# Patient Record
Sex: Female | Born: 1945 | Race: White | Hispanic: No | Smoking: Never smoker
Health system: Southern US, Community
[De-identification: ages and names within clinical notes are randomized; demographics above are authoritative.]

## PROBLEM LIST (undated history)

## (undated) DIAGNOSIS — E079 Disorder of thyroid, unspecified: Secondary | ICD-10-CM

## (undated) DIAGNOSIS — N200 Calculus of kidney: Secondary | ICD-10-CM

## (undated) DIAGNOSIS — R519 Headache, unspecified: Secondary | ICD-10-CM

## (undated) DIAGNOSIS — I10 Essential (primary) hypertension: Secondary | ICD-10-CM

## (undated) DIAGNOSIS — E7800 Pure hypercholesterolemia, unspecified: Secondary | ICD-10-CM

## (undated) DIAGNOSIS — G8929 Other chronic pain: Secondary | ICD-10-CM

## (undated) HISTORY — DX: Calculus of kidney: N20.0

## (undated) HISTORY — PX: TUBAL LIGATION: SHX77

## (undated) HISTORY — DX: Headache, unspecified: R51.9

## (undated) HISTORY — DX: Essential (primary) hypertension: I10

## (undated) HISTORY — PX: OTHER SURGICAL HISTORY: SHX169

## (undated) HISTORY — DX: Pure hypercholesterolemia, unspecified: E78.00

## (undated) HISTORY — DX: Disorder of thyroid, unspecified: E07.9

## (undated) HISTORY — DX: Other chronic pain: G89.29

---

## 1998-07-10 HISTORY — PX: APPENDECTOMY: SHX54

## 2011-11-24 ENCOUNTER — Encounter: Payer: Self-pay | Admitting: Family Medicine

## 2016-11-14 ENCOUNTER — Encounter: Payer: Self-pay | Admitting: Family Medicine

## 2018-05-09 LAB — HM MAMMOGRAPHY

## 2018-12-18 ENCOUNTER — Emergency Department (HOSPITAL_COMMUNITY): Payer: 59

## 2018-12-18 ENCOUNTER — Emergency Department (HOSPITAL_COMMUNITY)
Admission: EM | Admit: 2018-12-18 | Discharge: 2018-12-18 | Disposition: A | Discharge status: Home / Self Care | Payer: 59 | Attending: Emergency Medicine | Admitting: Emergency Medicine

## 2018-12-18 DIAGNOSIS — S43005A Unspecified dislocation of left shoulder joint, initial encounter: Secondary | ICD-10-CM | POA: Diagnosis not present

## 2018-12-18 DIAGNOSIS — Y998 Other external cause status: Secondary | ICD-10-CM | POA: Diagnosis not present

## 2018-12-18 DIAGNOSIS — S42252A Displaced fracture of greater tuberosity of left humerus, initial encounter for closed fracture: Secondary | ICD-10-CM | POA: Diagnosis not present

## 2018-12-18 DIAGNOSIS — R200 Anesthesia of skin: Secondary | ICD-10-CM | POA: Diagnosis not present

## 2018-12-18 DIAGNOSIS — Y9301 Activity, walking, marching and hiking: Secondary | ICD-10-CM | POA: Diagnosis not present

## 2018-12-18 DIAGNOSIS — W010XXA Fall on same level from slipping, tripping and stumbling without subsequent striking against object, initial encounter: Secondary | ICD-10-CM | POA: Insufficient documentation

## 2018-12-18 DIAGNOSIS — S4992XA Unspecified injury of left shoulder and upper arm, initial encounter: Secondary | ICD-10-CM | POA: Diagnosis present

## 2018-12-18 DIAGNOSIS — Y92019 Unspecified place in single-family (private) house as the place of occurrence of the external cause: Secondary | ICD-10-CM | POA: Insufficient documentation

## 2018-12-18 DIAGNOSIS — Z79899 Other long term (current) drug therapy: Secondary | ICD-10-CM | POA: Insufficient documentation

## 2018-12-18 MED ORDER — MORPHINE SULFATE (PF) 4 MG/ML IV SOLN
4.0000 mg | Freq: Once | INTRAVENOUS | Status: AC
  Administered 2018-12-18: 4 mg via INTRAVENOUS
  Filled 2018-12-18: qty 1

## 2018-12-18 MED ORDER — PROPOFOL 10 MG/ML IV BOLUS
INTRAVENOUS | Status: AC | PRN
  Administered 2018-12-18: 9.18 mg via INTRAVENOUS
  Administered 2018-12-18: 30.6 mg via INTRAVENOUS
  Administered 2018-12-18: 10.404 mg via INTRAVENOUS
  Administered 2018-12-18: 9.792 mg via INTRAVENOUS
  Administered 2018-12-18: 20.196 mg via INTRAVENOUS

## 2018-12-18 MED ORDER — PROPOFOL 10 MG/ML IV BOLUS
INTRAVENOUS | Status: AC
  Filled 2018-12-18: qty 40

## 2018-12-18 MED ORDER — SODIUM CHLORIDE 0.9 % IV SOLN
INTRAVENOUS | Status: AC | PRN
  Administered 2018-12-18: 500 mL via INTRAVENOUS

## 2018-12-18 MED ORDER — SODIUM CHLORIDE 0.9 % IV BOLUS
500.0000 mL | Freq: Once | INTRAVENOUS | Status: AC
  Administered 2018-12-18: 12:00:00 500 mL via INTRAVENOUS

## 2018-12-18 MED ORDER — ONDANSETRON HCL 4 MG/2ML IJ SOLN
4.0000 mg | Freq: Once | INTRAMUSCULAR | Status: AC
  Administered 2018-12-18: 4 mg via INTRAVENOUS
  Filled 2018-12-18: qty 2

## 2018-12-18 NOTE — ED Triage Notes (Signed)
Pt from home.  Pt tripped and fell this am and hit her head and shoulder on the wall.  No pt c/o of head pain.  Obvious deformity to L shoulder.  Pain 10/10.  Radial Pulse was weak in L arm.  Unable to feel radial pulse on arrival to ED

## 2018-12-18 NOTE — ED Provider Notes (Signed)
[facility]-EMERGENCY DEPT Provider Note   CSN: [id number] Arrival date & time: 12/18/18  1056     History   Chief Complaint Chief Complaint  Patient presents with  . Dislocation    HPI Rebecca Davidson is a 73 y.o. female.     The history is provided by the patient.  Arm Injury Location:  Shoulder Shoulder location:  L shoulder Injury: yes   Time since incident:  1 hour Mechanism of injury: fall   Fall:    Fall occurred: Patient had just got back from the store and had some bags sitting on the floor and she walked by and slipped on 1 of the bags causing her to fall directly into the wall on her left shoulder.   Impact surface:  Hard floor   Point of impact: The left shoulder. Pain details:    Quality:  Aching, sharp, shooting and throbbing   Radiates to:  L shoulder   Severity:  Severe   Onset quality:  Sudden   Duration:  1 hour   Timing:  Constant   Progression:  Unchanged Handedness:  Right-handed Dislocation: yes   Foreign body present:  No foreign bodies Prior injury to area:  No Relieved by:  Nothing Worsened by:  Movement Ineffective treatments:  Immobilization and narcotics Associated symptoms: numbness   Associated symptoms comment:  She has numbness and tingling of her hands since the accident Risk factors: no frequent fractures   Risk factors comment:  No head injury, loss of consciousness.  Patient does not take anticoagulation   No past medical history on file.  There are no active problems to display for this patient.      OB History   No obstetric history on file.      Home Medications    Prior to Admission medications   Medication Sig Start Date End Date Taking? Authorizing Provider  cholecalciferol (VITAMIN D3) 25 MCG (1000 UT) tablet Take 1,000 Units by mouth daily.   Yes [provider]  Magnesium 250 MG TABS Take 250 mg by mouth daily.   Yes [provider]  Multiple Vitamins-Minerals (CENTRUM  ADULTS PO) Take 1 tablet by mouth daily.   Yes [provider]  propranolol (INDERAL) 60 MG tablet Take 60 mg by mouth at bedtime.   Yes [provider]  SUMAtriptan Succinate (IMITREX PO) Take 1 tablet by mouth every 2 (two) hours as needed (headache, may take additional dose for relief, dont exceed more than 2/day).   Yes [provider]    Family History No family history on file.  Social History Social History   Tobacco Use  . Smoking status: Not on file  Substance Use Topics  . Alcohol use: Not on file  . Drug use: Not on file     Allergies   Myrbetriq [mirabegron], Doxycycline, and Sulfa antibiotics   Review of Systems Review of Systems  All other systems reviewed and are negative.    Physical Exam Updated Vital Signs BP 135/69 (BP Location: Right Arm)   Pulse 75   Resp (!) 27   Wt 61.2 kg   SpO2 98%   Physical Exam Vitals signs and nursing note reviewed.  Constitutional:      General: She is in acute distress.     Appearance: She is well-developed and normal weight.     Comments: Appears to be in a lot of pain  HENT:     Head: Normocephalic and atraumatic.  Eyes:  Conjunctiva/sclera: Conjunctivae normal.     Pupils: Pupils are equal, round, and reactive to light.  Neck:     Musculoskeletal: Normal range of motion and neck supple. No spinous process tenderness or muscular tenderness.  Cardiovascular:     Rate and Rhythm: Normal rate and regular rhythm.     Heart sounds: No murmur.  Pulmonary:     Effort: Pulmonary effort is normal. No respiratory distress.     Breath sounds: Normal breath sounds. No wheezing or rales.  Abdominal:     General: There is no distension.     Palpations: Abdomen is soft.     Tenderness: There is no abdominal tenderness. There is no guarding or rebound.  Musculoskeletal:     Left shoulder: She exhibits decreased range of motion, tenderness, bony tenderness, deformity, pain and abnormal pulse.      Comments: No palpable radial pulse in the left wrist and hand is cold but capillary refill is 3 seconds.  Decreased sensation in the left hand.  No left-sided clavicular tenderness  Skin:    General: Skin is warm and dry.     Findings: No erythema or rash.  Neurological:     Mental Status: She is alert and oriented to person, place, and time.  Psychiatric:        Mood and Affect: Mood normal.        Behavior: Behavior normal.        Thought Content: Thought content normal.      ED Treatments / Results  Labs (all labs ordered are listed, but only abnormal results are displayed) Labs Reviewed - No data to display  EKG None  Radiology Dg Shoulder Left Portable  Result Date: 12/18/2018 CLINICAL DATA:  Status post reduction of left shoulder dislocation. EXAM: LEFT SHOULDER - 1 VIEW COMPARISON:  Radiographs of same day. FINDINGS: Anterior dislocation noted on prior exam as been significantly reduced. Visualized ribs are unremarkable. Possible mildly displaced greater tuberosity fracture is noted. IMPRESSION: Successful reduction of anterior dislocation of left shoulder as described on prior exam. Probable mildly displaced greater tuberosity fracture is noted. Electronically Signed   By: Lupita RaiderJames  Green Jr M.D.   On: 12/18/2018 12:26   Dg Shoulder Left Portable  Result Date: 12/18/2018 CLINICAL DATA:  Fall, LEFT shoulder deformity and pain. EXAM: LEFT SHOULDER - 1 VIEW COMPARISON:  None. FINDINGS: Anterior dislocation of the LEFT humeral head. No fracture line or displaced fracture fragment seen. Chromic clavicular joint space is normally aligned. Soft tissues about the LEFT shoulder are unremarkable. IMPRESSION: Anterior dislocation of the LEFT humeral head. Electronically Signed   By: Bary RichardStan  Maynard M.D.   On: 12/18/2018 11:47    Procedures .Sedation  Date/Time: 12/18/2018 1:34 PM Performed by: Gwyneth SproutPlunkett, Mathea Frieling, MD Authorized by: Gwyneth SproutPlunkett, Lehman Whiteley, MD   Consent:    Consent  obtained:  Verbal   Consent given by:  Patient   Risks discussed:  Allergic reaction, dysrhythmia, inadequate sedation, nausea, prolonged hypoxia resulting in organ damage, prolonged sedation necessitating reversal, respiratory compromise necessitating ventilatory assistance and intubation and vomiting   Alternatives discussed:  Analgesia without sedation, anxiolysis and regional anesthesia Universal protocol:    Procedure explained and questions answered to patient or proxy's satisfaction: yes     Relevant documents present and verified: yes     Test results available and properly labeled: yes     Imaging studies available: yes     Required blood products, implants, devices, and special equipment available: yes  Site/side marked: yes     Immediately prior to procedure a time out was called: yes     Patient identity confirmation method:  Verbally with patient Indications:    Procedure performed:  Dislocation reduction   Procedure necessitating sedation performed by:  Physician performing sedation Pre-sedation assessment:    Time since last food or drink:  3 hours   ASA classification: class 1 - normal, healthy patient     Neck mobility: normal     Mouth opening:  3 or more finger widths   Thyromental distance:  4 finger widths   Mallampati score:  I - soft palate, uvula, fauces, pillars visible   Pre-sedation assessments completed and reviewed: airway patency, cardiovascular function, hydration status, mental status, nausea/vomiting, pain level, respiratory function and temperature     Pre-sedation assessment completed:  12/18/2018 11:34 PM Immediate pre-procedure details:    Reassessment: Patient reassessed immediately prior to procedure     Reviewed: vital signs, relevant labs/tests and NPO status     Verified: bag valve mask available, emergency equipment available, intubation equipment available, IV patency confirmed, oxygen available and suction available   Procedure details (see  MAR for exact dosages):    Preoxygenation:  Nasal cannula   Sedation:  Propofol   Analgesia:  Morphine   Intra-procedure monitoring:  Blood pressure monitoring, cardiac monitor, continuous pulse oximetry, frequent LOC assessments, frequent vital sign checks and continuous capnometry   Intra-procedure events: none     Intra-procedure management:  BVM ventilation   Total Provider sedation time (minutes):  15 Post-procedure details:    Post-sedation assessment completed:  12/18/2018 1:34 PM   Attendance: Constant attendance by certified staff until patient recovered     Recovery: Patient returned to pre-procedure baseline     Post-sedation assessments completed and reviewed: airway patency, cardiovascular function, hydration status, mental status, nausea/vomiting, pain level, respiratory function and temperature     Patient is stable for discharge or admission: yes     Patient tolerance:  Tolerated well, no immediate complications Reduction of dislocation  Date/Time: 12/18/2018 1:35 PM Performed by: Gwyneth SproutPlunkett, Raziah Funnell, MD Authorized by: Gwyneth SproutPlunkett, Launa Goedken, MD  Consent: Verbal consent obtained. Consent given by: patient Patient understanding: patient states understanding of the procedure being performed Patient consent: the patient's understanding of the procedure matches consent given Procedure consent: procedure consent matches procedure scheduled Imaging studies: imaging studies available Patient identity confirmed: verbally with patient Time out: Immediately prior to procedure a "time out" was called to verify the correct patient, procedure, equipment, support staff and site/side marked as required. Local anesthesia used: no  Anesthesia: Local anesthesia used: no  Sedation: Patient sedated: yes Sedation type: moderate (conscious) sedation Sedatives: propofol Analgesia: morphine Vitals: Vital signs were monitored during sedation.  Patient tolerance: patient tolerated the procedure  well with no immediate complications Comments: Left shoulder dislocation reduced with arm abduction and extension with visual and auditory click when joint reduced.  Now 2+ radial pulse and warm left hand.  Sensation is intact    (including critical care time)  Medications Ordered in ED Medications  propofol (DIPRIVAN) 10 mg/mL bolus/IV push (has no administration in time range)  morphine 4 MG/ML injection 4 mg (4 mg Intravenous Given 12/18/18 1132)  ondansetron (ZOFRAN) injection 4 mg (4 mg Intravenous Given 12/18/18 1132)  sodium chloride 0.9 % bolus 500 mL (0 mLs Intravenous Stopped 12/18/18 1159)  propofol (DIPRIVAN) 10 mg/mL bolus/IV push (9.792 mg Intravenous Given 12/18/18 1135)  0.9 %  sodium chloride infusion (500  mLs Intravenous New Bag/Given 12/18/18 1137)     Initial Impression / Assessment and Plan / ED Course  I have reviewed the triage vital signs and the nursing notes.  Pertinent labs & imaging results that were available during my care of the patient were reviewed by me and considered in my medical decision making (see chart for details).        73 year old female presenting today after a fall when she slipped on a plastic bag.  Direct impact to the left shoulder with deformity and severe pain.  Concern for vascular compromise as patient's hand is cold and she is complaining of numbness.  No palpable pulse but still capillary refill is 3 seconds.  X-ray shows a dislocation and patient was sedated with propofol with reduction and improvement.  She now has 2+ radial pulse, normal sensation in her hand and hand is now the same temperature as her other 1.  Repeat x-ray shows reduction with possible greater tuberosity fracture.  Patient was placed in a shoulder immobilizer and will need follow-up with orthopedics.  Low suspicion for other injury at this time as patient did not hit her head lose consciousness and has no neck pain.  She has been able to ambulate without difficulty and  will be discharged home. Final Clinical Impressions(s) / ED Diagnoses   Final diagnoses:  Dislocation of left shoulder joint, initial encounter  Closed displaced fracture of greater tuberosity of left humerus, initial encounter    ED Discharge Orders    None       Blanchie Dessert, MD 12/18/18 1338

## 2018-12-18 NOTE — Sedation Documentation (Signed)
Pop heard on shoulder

## 2018-12-18 NOTE — Sedation Documentation (Signed)
PT being bagged at this time.

## 2018-12-18 NOTE — Discharge Instructions (Signed)
Wear your sling pretty much all the time for the first 2 weeks except when you are bathing.  Use ibuprofen or Tylenol as needed for the pain and ice.  Avoid lifting with the left arm.

## 2018-12-18 NOTE — Sedation Documentation (Signed)
Pulse felt in left wrist

## 2018-12-18 NOTE — Sedation Documentation (Signed)
Pt continues to be bagged by Levada Dy RT

## 2018-12-25 ENCOUNTER — Ambulatory Visit (INDEPENDENT_AMBULATORY_CARE_PROVIDER_SITE_OTHER): Payer: Medicare Other | Admitting: Orthopaedic Surgery

## 2018-12-25 ENCOUNTER — Encounter: Payer: Self-pay | Admitting: Orthopaedic Surgery

## 2018-12-25 DIAGNOSIS — S43005A Unspecified dislocation of left shoulder joint, initial encounter: Secondary | ICD-10-CM

## 2018-12-25 DIAGNOSIS — S43006A Unspecified dislocation of unspecified shoulder joint, initial encounter: Secondary | ICD-10-CM | POA: Insufficient documentation

## 2018-12-25 MED ORDER — TRAMADOL HCL 50 MG PO TABS: 50.0000 mg | ORAL_TABLET | Freq: Four times a day (QID) | ORAL | 0 refills | Status: DC | PRN

## 2018-12-25 NOTE — Progress Notes (Signed)
Office Visit Note   Patient: Rebecca Davidson           Date of Birth: [birth year]           MRN: [id number] Visit Date: 12/25/2018              Requested by: No referring provider defined for this encounter. PCP: Patient, No Pcp Per   Assessment & Plan: Visit Diagnoses:  1. Dislocation of left shoulder joint, initial encounter   2.     With left greater tuberosity fracture.  Plan: Post reduction for x-rays demonstrate a greater tuberosity fragment in reasonable position.  We discussed continued immobilization recheck 3 weeks with repeat x-rays 2 views of her shoulder on return AP and Y scapular view.  Should continue to use the shoulder immobilizer.  She can remove the posterior strap during the day if she so desires.  Ultram sent in for pain  Follow-Up Instructions: Return in about 3 weeks (around 01/15/2019).   Orders:  No orders of the defined types were placed in this encounter.  Meds ordered this encounter  Medications  . traMADol (ULTRAM) 50 MG tablet    Sig: Take 1 tablet (50 mg total) by mouth every 6 (six) hours as needed.    Dispense:  20 tablet    Refill:  0      Procedures: No procedures performed   Clinical Data: No additional findings.   Subjective: Chief Complaint  Patient presents with  . Left Shoulder - Pain    DOI 12/18/2018    HPI 73 year old [place] was on packing some groceries slipped fell against the wall hard and suffered a left shoulder fracture dislocation with greater tuberosity fracture.  She had to be reduced under conscious sedation in the ER by the ER staff and after reduction was placed in a shoulder immobilizer.  She is right-hand dominant.  She is noticed numbness laterally over the shoulder consistent with some axillary nerve neuropraxia.  She declined pain medication but states now she thinks it might have been a mistake.  Review of Systems previous appendectomy, tonsillectomy, history of kidney stone  removal, of fallopian tubes and ovaries resected.  Possible hypertension, normal cholesterol.  Otherwise noncontributory 14 point system update.   Objective: Vital Signs: BP (!) 153/92   Pulse 92   Ht 5\' 2"  (1.575 m)   Wt 135 lb (61.2 kg)   BMI 24.69 kg/m   Physical Exam Constitutional:      Appearance: She is well-developed.  HENT:     Head: Normocephalic.     Right Ear: External ear normal.     Left Ear: External ear normal.  Eyes:     Pupils: Pupils are equal, round, and reactive to light.  Neck:     Thyroid: No thyromegaly.     Trachea: No tracheal deviation.  Cardiovascular:     Rate and Rhythm: Normal rate.  Pulmonary:     Effort: Pulmonary effort is normal.  Abdominal:     Palpations: Abdomen is soft.  Skin:    General: Skin is warm and dry.  Neurological:     Mental Status: She is alert and oriented to person, place, and time.  Psychiatric:        Behavior: Behavior normal.     Ortho Exam patient has minimal swelling in her fingers.  Decreased sensation laterally over the deltoid axillary sensory nerve portion.  Musculotendinous is active.  Bruising located axillary and left upper  arm. Specialty Comments:  No specialty comments available.  Imaging: No results found.   PMFS History: Patient Active Problem List   Diagnosis Date Noted  . Shoulder dislocation 12/25/2018   No past medical history on file.  No family history on file. Social History   Occupational History  . Not on file  Tobacco Use  . Smoking status: Never Smoker  . Smokeless tobacco: Never Used  Substance and Sexual Activity  . Alcohol use: Not Currently  . Drug use: Not on file  . Sexual activity: Not on file

## 2018-12-31 ENCOUNTER — Telehealth: Payer: Self-pay | Admitting: Orthopaedic Surgery

## 2018-12-31 NOTE — Telephone Encounter (Signed)
Patient called and stated she needed to speak with Gwinda Passe because you have questions regarding her shoulder, arm and hand.  Please call @ [phone]

## 2018-12-31 NOTE — Telephone Encounter (Signed)
noted 

## 2018-12-31 NOTE — Telephone Encounter (Signed)
I called discussed.  

## 2018-12-31 NOTE — Telephone Encounter (Signed)
I called and spoke with patient. She states that she has had a little more swelling in her fingers and is unable to bend her thumb down. She uses the hand to hold a coffee cup or button her shirt, but is noticing that she cannot grasp. She is in the sling 24/7. She also states that she only took 2 tramadol because they only took the edge off and has been using tylenol for pain. She continues to be uncomfortable.   Patient would like to know if you feel that she is using her hand too much, or if this is something she should be concerned about.  She states that she handles pain pretty well but is still really uncomfortable and is now concerned with the swelling and the thumb.

## 2019-01-15 ENCOUNTER — Encounter: Payer: Self-pay | Admitting: Orthopaedic Surgery

## 2019-01-15 ENCOUNTER — Ambulatory Visit: Payer: Self-pay

## 2019-01-15 ENCOUNTER — Ambulatory Visit (INDEPENDENT_AMBULATORY_CARE_PROVIDER_SITE_OTHER): Payer: Medicare Other

## 2019-01-15 ENCOUNTER — Ambulatory Visit (INDEPENDENT_AMBULATORY_CARE_PROVIDER_SITE_OTHER): Payer: Medicare Other | Admitting: Orthopaedic Surgery

## 2019-01-15 VITALS — BP 157/94 | HR 81 | Ht 62.0 in | Wt 135.0 lb

## 2019-01-15 DIAGNOSIS — S43005A Unspecified dislocation of left shoulder joint, initial encounter: Secondary | ICD-10-CM

## 2019-01-15 DIAGNOSIS — M25532 Pain in left wrist: Secondary | ICD-10-CM | POA: Diagnosis not present

## 2019-01-15 NOTE — Progress Notes (Signed)
Limited diagnostic ultrasound left thumb: FPL tendon appears to have a partial tear near the insertion on the distal phalanx.  With passive dorsiflexion of the IP joint, the tendon still moves proximally so I do not believe it is completely torn.  Findings were reviewed with Dr. Lorin Mercy.

## 2019-01-15 NOTE — Progress Notes (Signed)
Office Visit Note   Patient: Rebecca Davidson           Date of Birth: Mar 10, 1946           MRN: [id number] Visit Date: 01/15/2019              Requested by: No referring provider defined for this encounter. PCP: Patient, No Pcp Per   Assessment & Plan: Visit Diagnoses:  1. Dislocation of left shoulder joint, initial encounter   2. Pain in left wrist   2.     FPL partial tear at insertion site , left , suggested by ultrasound .   Plan: Ultrasound of the thumb by Rebecca Davidson suggests a partial tearing of the distal FPL in comparison to the opposite thumb.  We will refer her to hand surgery for evaluation she may need an MRI of her thumb which I discussed with her.  Ultrasound does appear to show some portion of the tendon and suggest partial tear distally.  Carpal tunnel exam demonstrated no spurring in the carpal canal.  She does not have tenderness proximally over the FPL typical of an acute rupture.  No injury to her wrist or spurs on carpal tunnel view that would be suspicious for possible attritional rupture at the level of the distal radius.  She has some tenderness over the proximal forearm over the interosseous nerve by the pronator but only has absent FPL flexion suggesting this is not anterior interosseous nerve problem.  Will refer to Rebecca Davidson or 1 of his partners for their input and treatment.  She can follow-up in 3 weeks to see RebeccaXu since I discussed her case with him.  Follow-Up Instructions: Return in about 3 weeks (around 02/05/2019).   Orders:  Orders Placed This Encounter  Procedures  . XR Shoulder Left  . XR Wrist 2 Views Left  . Ambulatory referral to Orthopedic Surgery   No orders of the defined types were placed in this encounter.     Procedures: No procedures performed   Clinical Data: No additional findings.   Subjective: Chief Complaint  Patient presents with  . Left Shoulder - Follow-up    DOI 12/18/2018    HPI 73 year old female returns for  follow-up after shoulder dislocation reduction with greater tuberosity fracture in a sling.  She had some decreased sensation over axillary sensory nerve deltoid region which persists.  She states she is noticed some increased pain in the proximal forearm and has been using her sling but noticed she cannot flex her left thumb.  At the time of her fall she denies injury to her thumb.  No past injury to the thumb no past history of wrist fracture.  She can extend the thumb but cannot flex the IP joint.  Review of Systems review of systems updated unchanged other than mentioned above since last office visit.   Objective: Vital Signs: BP (!) 157/94   Pulse 81   Ht 5\' 2"  (1.575 m)   Wt 135 lb (61.2 kg)   BMI 24.69 kg/m   Physical Exam Constitutional:      Appearance: She is well-developed.  HENT:     Head: Normocephalic.     Right Ear: External ear normal.     Left Ear: External ear normal.  Eyes:     Pupils: Pupils are equal, round, and reactive to light.  Neck:     Thyroid: No thyromegaly.     Trachea: No tracheal deviation.  Cardiovascular:  Rate and Rhythm: Normal rate.  Pulmonary:     Effort: Pulmonary effort is normal.  Abdominal:     Palpations: Abdomen is soft.  Skin:    General: Skin is warm and dry.  Neurological:     Mental Status: She is alert and oriented to person, place, and time.  Psychiatric:        Behavior: Behavior normal.     Ortho Exam patient has intact finger flexion extension.  Unable to flex IP joint of the thumb left hand.  She still has decreased sensation laterally over the deltoid region left shoulder.  Median and ulnar sensation is intact interossei are strong.  Normal finger extension EPL and EDC.  Specialty Comments:  No specialty comments available.  Imaging: No results found.   PMFS History: Patient Active Problem List   Diagnosis Date Noted  . Shoulder dislocation 12/25/2018   No past medical history on file.  Social History    Occupational History  . Not on file  Tobacco Use  . Smoking status: Never Smoker  . Smokeless tobacco: Never Used  Substance and Sexual Activity  . Alcohol use: Not Currently  . Drug use: Not on file  . Sexual activity: Not on file

## 2019-01-16 ENCOUNTER — Telehealth: Payer: Self-pay | Admitting: Orthopaedic Surgery

## 2019-01-16 NOTE — Telephone Encounter (Signed)
Patient called. Says she has not heard anything about an appointment with a hand surgeon.  Would like someone to call her. Her call back number is [phone]

## 2019-01-17 NOTE — Telephone Encounter (Signed)
I called and discussed. OK Xu in 3 wks.  She can see me in November as scheduled. Thanks. Dr.Xu saw her when I had her in the office and do not think he will mind to see her once and decide if she needs to start some therapy in 3 wks. Thanks

## 2019-01-17 NOTE — Telephone Encounter (Signed)
Information has been sent to the [facility]. They should call patient to make an appointment. I will call patient to advise.

## 2019-01-17 NOTE — Telephone Encounter (Signed)
I called patient. She has appt to see Dr. Fredna Dow for her thumb on 01/21/2019.  She has concerns regarding her shoulder and following up in 7 weeks. She states that she does not know if she is supposed to stay in the sling that long and that the shoulder is hurting more now that she is doing the exercises that Dr Lorin Mercy recommended. Per office note, patient was to follow up with Dr. Erlinda Hong in 3 weeks. She states that she was unaware this is the plan. Would you like for me to make patient follow up for shoulder with Dr. Erlinda Hong?

## 2019-01-18 NOTE — Telephone Encounter (Signed)
I called patient and made follow up appt with Dr. Erlinda Hong.

## 2019-01-28 ENCOUNTER — Other Ambulatory Visit: Payer: Self-pay | Admitting: Orthopedic Surgery

## 2019-01-28 DIAGNOSIS — S66812A Strain of other specified muscles, fascia and tendons at wrist and hand level, left hand, initial encounter: Secondary | ICD-10-CM

## 2019-02-05 ENCOUNTER — Encounter: Payer: Self-pay | Admitting: Orthopaedic Surgery

## 2019-02-05 ENCOUNTER — Ambulatory Visit (INDEPENDENT_AMBULATORY_CARE_PROVIDER_SITE_OTHER): Payer: Medicare Other | Admitting: Orthopaedic Surgery

## 2019-02-05 ENCOUNTER — Ambulatory Visit: Payer: Self-pay

## 2019-02-05 DIAGNOSIS — S43005A Unspecified dislocation of left shoulder joint, initial encounter: Secondary | ICD-10-CM | POA: Diagnosis not present

## 2019-02-05 NOTE — Progress Notes (Signed)
   Office Visit Note   Patient: Rebecca Davidson           Date of Birth: 1945-07-20           MRN: [id number] Visit Date: 02/05/2019              Requested by: No referring provider defined for this encounter. PCP: Patient, No Pcp Per   Assessment & Plan: Visit Diagnoses:  1. Dislocation of left shoulder joint, initial encounter     Plan: Impression is 7 weeks status post left shoulder dislocation and greater tuberosity fracture with axillary nerve palsy.  It is also hard to tell if she is unable to abduct her shoulder given her mechanism of injury and age, and the possibility of rotator cuff pathology.  At this point, we will obtain a nerve conduction study of the left upper extremity to further assess this.  She will follow up with [place] once completed for discussion as well as for repeat x-rays of the left shoulder to include AP and scapular Y views.  In the meantime, we will go ahead and have her discontinue the sling and start formal physical therapy for range of motion and strengthening and mobilization of the left shoulder.  Follow-Up Instructions: Return for after NCS/EMG.   Orders:  Orders Placed This Encounter  Procedures  . XR Shoulder Left   No orders of the defined types were placed in this encounter.     Procedures: No procedures performed   Clinical Data: No additional findings.   Subjective: Chief Complaint  Patient presents with  . Left Shoulder - Follow-up    HPI patient is a pleasant 73 year old female patient of Dr. Lorin Mercy who we are seeing today in the absence.  She is approximately 7 weeks status post left shoulder dislocation with subsequent greater tuberosity fracture.  She has been compliant wearing her sling.  She complains of moderate pain to the left shoulder, but is not taking any over-the-counter prescription pain medication for this.  She is also complaining of numbness to the deltoid.  This has been ongoing since initial injury.  Review of Systems as  detailed in HPI.  All others reviewed and are negative.   Objective: Vital Signs: There were no vitals taken for this visit.  Physical Exam well-developed and well-nourished female in no acute distress.  Alert and oriented x3.  Ortho Exam examination of her left shoulder reveals moderate tenderness over the proximal humerus.  She has little abduction of the shoulder.  Decreased sensation to the deltoid.  Specialty Comments:  No specialty comments available.  Imaging: Xr Shoulder Left  Result Date: 02/05/2019 X-rays reveal stable alignment of the greater tuberosity fracture    PMFS History: Patient Active Problem List   Diagnosis Date Noted  . Shoulder dislocation 12/25/2018   History reviewed. No pertinent past medical history.  History reviewed. No pertinent family history.  History reviewed. No pertinent surgical history. Social History   Occupational History  . Not on file  Tobacco Use  . Smoking status: Never Smoker  . Smokeless tobacco: Never Used  Substance and Sexual Activity  . Alcohol use: Not Currently  . Drug use: Not on file  . Sexual activity: Not on file

## 2019-02-08 ENCOUNTER — Telehealth: Payer: Self-pay | Admitting: Orthopaedic Surgery

## 2019-02-08 NOTE — Telephone Encounter (Signed)
FAXED

## 2019-02-08 NOTE — Telephone Encounter (Signed)
Rebecca Davidson from [facility] called requesting the RX for the PT be faxed over to them at [phone].  Patient has an appointment at 9:00 this morning.  CB#[phone].  Thank you.

## 2019-03-01 ENCOUNTER — Ambulatory Visit (INDEPENDENT_AMBULATORY_CARE_PROVIDER_SITE_OTHER): Payer: Medicare Other | Admitting: Physical Medicine and Rehabilitation

## 2019-03-01 ENCOUNTER — Encounter: Payer: Self-pay | Admitting: Family Medicine

## 2019-03-01 ENCOUNTER — Encounter: Payer: Self-pay | Admitting: Physical Medicine and Rehabilitation

## 2019-03-01 ENCOUNTER — Other Ambulatory Visit: Payer: Self-pay

## 2019-03-01 DIAGNOSIS — R202 Paresthesia of skin: Secondary | ICD-10-CM

## 2019-03-01 DIAGNOSIS — R531 Weakness: Secondary | ICD-10-CM

## 2019-03-01 NOTE — Progress Notes (Signed)
  Numeric Pain Rating Scale and Functional Assessment Average Pain 8   In the last MONTH (on 0-10 scale) has pain interfered with the following?  1. General activity like being  able to carry out your everyday physical activities such as walking, climbing stairs, carrying groceries, or moving a chair?  Rating(7)     

## 2019-03-04 NOTE — Progress Notes (Signed)
Rebecca Davidson - 73 y.o. female MRN [id number]  Date of birth: July 19, 1945  Office Visit Note: Visit Date: 03/01/2019 PCP: Patient, No Pcp Per Referred by: Tarry Kos, MD  Subjective: Chief Complaint  Patient presents with   Left Shoulder - Pain, Numbness   Left Arm - Pain, Numbness   HPI: Rebecca Davidson is a 73 y.o. female who comes in today At the request of Dr. Glee Arvin for electrodiagnostic study of the left upper limb.  Patient reports that on August 18 she had just returned from shopping and had grocery bags on the floor.  She went by those and tripped and fell with her shoulder up against the wall.  She suffered from a shoulder dislocation.  She was seen in the emergency department and had reduction performed there and was seen by Dr. Annell Greening.  She had a fairly extensive time with the arm in a sling.  1 months post shoulder dislocation she was having numbness around the shoulder and an axillary nerve distribution.  She also had inability to flex the DIP joint of the thumb.  Ultrasound evaluation showed questionable tendon tear.  She was referred to Dr. Merlyn Lot for evaluation.  It is felt that this was more of a tendon issue and not related to the anterior interosseous nerve.  Dr. Merlyn Lot suggested MRI of the hand but the patient reports that she is unable to lay the hand still for an MRI.  She was also followed up by Dr. Roda Shutters who noted that she was having trouble with abduction of the shoulder somewhat with increased pain somewhat with just inability to abduct.  He requested electrodiagnostic study and she is here for that today.  She has no radicular type symptoms down the arm.  No right-sided complaints.  She is right-hand dominant.  ROS Otherwise per HPI.  Assessment & Plan: Visit Diagnoses:  1. Paresthesia of skin   2. 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 The lesion is characterized by sensory and motor demyelination with evidence of axonal injury.  Given active motor unit action potentials the prognosis for recovery of the nerve is good.  There could be residual skin numbness.   There is no significant electrodiagnostic evidence of any other focal nerve entrapment, brachial plexopathy or cervical radiculopathy.  Nothing really from an electrodiagnostic standpoint to explain the lack of flexion of the distal interphalangeal joint of the thumb.  Recommendations: 1.  Follow-up with referring physician. 2.  Continue current management of symptoms. 3.  Repeat studies in 3-4 months if symptoms are progressive or not resolved.  Meds & Orders: No orders of the defined types were placed in this encounter.   Orders Placed This Encounter  Procedures   NCV with EMG (electromyography)    Follow-up: Return for  Glee Arvin, M.D., Annell Greening, M.D..   Procedures: No procedures performed  EMG & NCV Findings: Evaluation of the left axillary motor nerve showed prolonged distal onset latency (16.3 ms).  All remaining nerves (as indicated in the following tables) were within normal limits.    Needle evaluation of the left deltoid muscle showed increased insertional activity, increased spontaneous activity, and diminished recruitment.  All remaining muscles (as indicated in the following table) showed no evidence of electrical instability.    Impression: The above electrodiagnostic study is ABNORMAL and reveals evidence of a severe left  axilary nerve injury affecting sensory and motor components. The lesion is characterized by sensory and motor demyelination with evidence of axonal injury.  Given active motor unit action potentials the prognosis for recovery of the nerve is good.  There could be residual skin numbness.   There is no significant electrodiagnostic evidence of any other focal nerve entrapment, brachial plexopathy or cervical  radiculopathy.  Nothing really from an electrodiagnostic standpoint to explain the lack of flexion of the distal interphalangeal joint of the thumb.  Recommendations: 1.  Follow-up with referring physician. 2.  Continue current management of symptoms. 3.  Repeat studies in 3-4 months if symptoms are progressive or not resolved.  ___________________________ Naaman Plummer FAAPMR Board Certified, American Board of Physical Medicine and Rehabilitation    Nerve Conduction Studies Anti Sensory Summary Table   Stim Site NR Peak (ms) Norm Peak (ms) P-T Amp (V) Norm P-T Amp Site1 Site2 Delta-P (ms) Dist (cm) Vel (m/s) Norm Vel (m/s)  Left Median Acr Palm Anti Sensory (2nd Digit)  31.6C  Wrist    3.3 <3.6 39.8 >10 Wrist Palm 1.6 0.0    Palm    1.7 <2.0 42.3         Left Radial Anti Sensory (Base 1st Digit)  32.1C  Wrist    2.3 <3.1 19.7  Wrist Base 1st Digit 2.3 0.0    Left Ulnar Anti Sensory (5th Digit)  32.2C  Wrist    3.3 <3.7 15.8 >15.0 Wrist 5th Digit 3.3 14.0 42 >38   Motor Summary Table   Stim Site NR Onset (ms) Norm Onset (ms) O-P Amp (mV) Norm O-P Amp Site1 Site2 Delta-0 (ms) Dist (cm) Vel (m/s) Norm Vel (m/s)  Left Axillary Motor (Deltoid)  31.9C  Clavicle    *16.3 <5 2.7  Clavicle Deltoid 16.3 22.0 13   Left Median Motor (Abd Poll Brev)  32C  Wrist    3.1 <4.2 5.9 >5 Elbow Wrist 3.7 19.0 51 >50  Elbow    6.8  5.9         Left Radial Motor (Ext Indicis)  32C  8cm    2.5 <2.5 5.6 >1.7 Up Arm 8cm 2.7 17.5 65 >60  Up Arm    5.2  5.0         Left Ulnar Motor (Abd Dig Min)  31.9C  Wrist    2.7 <4.2 8.6 >3 B Elbow Wrist 3.0 18.0 60 >53  B Elbow    5.7  7.2  A Elbow B Elbow 1.7 10.0 59 >53  A Elbow    7.4  4.8          EMG   Side Muscle Nerve Root Ins Act Fibs Psw Amp Dur Poly Recrt Int Dennie Bible Comment  Left Abd Poll Brev Median C8-T1 Nml Nml Nml Nml Nml 0 Nml Nml   Left 1stDorInt Ulnar C8-T1 Nml Nml Nml Nml Nml 0 Nml Nml   Left ExtIndicis Radial (Post Int) C7-8 Nml Nml Nml  Nml Nml 0 Nml Nml   Left ExtDigCom   Nml Nml Nml Nml Nml 0 Nml Nml   Left BrachioRad Radial C5-6 Nml Nml Nml Nml Nml 0 Nml Nml   Left Triceps Radial C6-7-8 Nml Nml Nml Nml Nml 0 Nml Nml   Left Deltoid Axillary C5-6 *Incr *3+ *3+ Nml Nml 0 *Reduced Nml   Left FlexPolLong Median (Ant Int) C7-8 Nml Nml Nml Nml Nml 0 Nml Nml     Nerve Conduction Studies Anti Sensory Left/Right Comparison  Stim Site L Lat (ms) R Lat (ms) L-R Lat (ms) L Amp (V) R Amp (V) L-R Amp (%) Site1 Site2 L Vel (m/s) R Vel (m/s) L-R Vel (m/s)  Median Acr Palm Anti Sensory (2nd Digit)  31.6C  Wrist 3.3   39.8   Wrist Palm     Palm 1.7   42.3         Radial Anti Sensory (Base 1st Digit)  32.1C  Wrist 2.3   19.7   Wrist Base 1st Digit     Ulnar Anti Sensory (5th Digit)  32.2C  Wrist 3.3   15.8   Wrist 5th Digit 42     Motor Left/Right Comparison   Stim Site L Lat (ms) R Lat (ms) L-R Lat (ms) L Amp (mV) R Amp (mV) L-R Amp (%) Site1 Site2 L Vel (m/s) R Vel (m/s) L-R Vel (m/s)  Axillary Motor (Deltoid)  31.9C  Clavicle *16.3   2.7   Clavicle Deltoid 13    Median Motor (Abd Poll Brev)  32C  Wrist 3.1   5.9   Elbow Wrist 51    Elbow 6.8   5.9         Radial Motor (Ext Indicis)  32C  8cm 2.5   5.6   Up Arm 8cm 65    Up Arm 5.2   5.0         Ulnar Motor (Abd Dig Min)  31.9C  Wrist 2.7   8.6   B Elbow Wrist 60    B Elbow 5.7   7.2   A Elbow B Elbow 59    A Elbow 7.4   4.8            Waveforms:                Clinical History: No specialty comments available.   She reports that she has never smoked. She has never used smokeless tobacco. No results for input(s): HGBA1C, LABURIC in the last 8760 hours.  Objective:  VS:  HT:     WT:    BMI:      BP:    HR: bpm   TEMP: ( )   RESP:  Physical Exam Musculoskeletal:        General: No swelling, tenderness or deformity.     Comments: Inspection reveals no atrophy of the bilateral APB or FDI or hand intrinsics. There is no swelling, color changes, allodynia  or dystrophic changes. There is 5 out of 5 strength in the bilateral wrist extension, finger abduction and long finger flexion.  She has difficulty with active abduction of the left arm.  She gets a few degrees of initiating motion which is likely the supraspinatus.  She can resist to a small degree.  She cannot actively flex the distal interphalangeal joint of the left thumb.  There is intact sensation to light touch in all dermatomal and peripheral nerve distributions except for patch of decreased sensation over the left shoulder laterally. There is a negative Froment's test bilaterally. There is a negative Tinel's test at the bilateral wrist and elbow. There is a negative Phalen's test bilaterally. There is a negative Hoffmann's test bilaterally.  Skin:    General: Skin is warm and dry.     Findings: No erythema or rash.  Neurological:     General: No focal deficit present.     Mental Status: She is alert and oriented to person, place, and time.     Motor: No weakness  or abnormal muscle tone.     Coordination: Coordination normal.  Psychiatric:        Mood and Affect: Mood normal.        Behavior: Behavior normal.     Ortho Exam Imaging: No results found.  Past Medical/Family/Surgical/Social History: Medications & Allergies reviewed per EMR, new medications updated. Patient Active Problem List   Diagnosis Date Noted   Shoulder dislocation 12/25/2018   History reviewed. No pertinent past medical history. History reviewed. No pertinent family history. History reviewed. No pertinent surgical history. Social History   Occupational History   Not on file  Tobacco Use   Smoking status: Never Smoker   Smokeless tobacco: Never Used  Substance and Sexual Activity   Alcohol use: Not Currently   Drug use: Not on file   Sexual activity: Not on file

## 2019-03-05 ENCOUNTER — Other Ambulatory Visit: Payer: Self-pay

## 2019-03-05 ENCOUNTER — Ambulatory Visit: Payer: 59

## 2019-03-05 ENCOUNTER — Encounter: Payer: Self-pay | Admitting: Orthopaedic Surgery

## 2019-03-05 ENCOUNTER — Ambulatory Visit: Payer: Medicare Other | Admitting: Orthopaedic Surgery

## 2019-03-05 VITALS — BP 140/86 | HR 76 | Ht 62.0 in | Wt 132.0 lb

## 2019-03-05 DIAGNOSIS — S4432XD Injury of axillary nerve, left arm, subsequent encounter: Secondary | ICD-10-CM | POA: Diagnosis not present

## 2019-03-05 DIAGNOSIS — S43005A Unspecified dislocation of left shoulder joint, initial encounter: Secondary | ICD-10-CM | POA: Diagnosis not present

## 2019-03-05 MED ORDER — GABAPENTIN 100 MG PO CAPS: 100.0000 mg | ORAL_CAPSULE | Freq: Every day | ORAL | Status: DC

## 2019-03-05 NOTE — Procedures (Signed)
EMG & NCV Findings: Evaluation of the left axillary motor nerve showed prolonged distal onset latency (16.3 ms).  All remaining nerves (as indicated in the following tables) were within normal limits.    Needle evaluation of the left deltoid muscle showed increased insertional activity, increased spontaneous activity, and diminished recruitment.  All remaining muscles (as indicated in the following table) showed no evidence of electrical instability.    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 The lesion is characterized by sensory and motor demyelination with evidence of axonal injury.  Given active motor unit action potentials the prognosis for recovery of the nerve is good.  There could be residual skin numbness.   There is no significant electrodiagnostic evidence of any other focal nerve entrapment, brachial plexopathy or cervical radiculopathy.  Nothing really from an electrodiagnostic standpoint to explain the lack of flexion of the distal interphalangeal joint of the thumb.  Recommendations: 1.  Follow-up with referring physician. 2.  Continue current management of symptoms. 3.  Repeat studies in 3-4 months if symptoms are progressive or not resolved.  ___________________________ Laurence Spates FAAPMR Board Certified, American Board of Physical Medicine and Rehabilitation    Nerve Conduction Studies Anti Sensory Summary Table   Stim Site NR Peak (ms) Norm Peak (ms) P-T Amp (V) Norm P-T Amp Site1 Site2 Delta-P (ms) Dist (cm) Vel (m/s) Norm Vel (m/s)  Left Median Acr Palm Anti Sensory (2nd Digit)  31.6C  Wrist    3.3 <3.6 39.8 >10 Wrist Palm 1.6 0.0    Palm    1.7 <2.0 42.3         Left Radial Anti Sensory (Base 1st Digit)  32.1C  Wrist    2.3 <3.1 19.7  Wrist Base 1st Digit 2.3 0.0    Left Ulnar Anti Sensory (5th Digit)  32.2C  Wrist    3.3 <3.7 15.8 >15.0 Wrist  5th Digit 3.3 14.0 42 >38   Motor Summary Table   Stim Site NR Onset (ms) Norm Onset (ms) O-P Amp (mV) Norm O-P Amp Site1 Site2 Delta-0 (ms) Dist (cm) Vel (m/s) Norm Vel (m/s)  Left Axillary Motor (Deltoid)  31.9C  Clavicle    *16.3 <5 2.7  Clavicle Deltoid 16.3 22.0 13   Left Median Motor (Abd Poll Brev)  32C  Wrist    3.1 <4.2 5.9 >5 Elbow Wrist 3.7 19.0 51 >50  Elbow    6.8  5.9         Left Radial Motor (Ext Indicis)  32C  8cm    2.5 <2.5 5.6 >1.7 Up Arm 8cm 2.7 17.5 65 >60  Up Arm    5.2  5.0         Left Ulnar Motor (Abd Dig Min)  31.9C  Wrist    2.7 <4.2 8.6 >3 B Elbow Wrist 3.0 18.0 60 >53  B Elbow    5.7  7.2  A Elbow B Elbow 1.7 10.0 59 >53  A Elbow    7.4  4.8          EMG   Side Muscle Nerve Root Ins Act Fibs Psw Amp Dur Poly Recrt Int Fraser Din Comment  Left Abd Poll Brev Median C8-T1 Nml Nml Nml Nml Nml 0 Nml Nml   Left 1stDorInt Ulnar C8-T1 Nml Nml Nml Nml Nml 0 Nml Nml   Left ExtIndicis Radial (Post Int) C7-8 Nml Nml Nml Nml Nml 0 Nml  Nml   Left ExtDigCom   Nml Nml Nml Nml Nml 0 Nml Nml   Left BrachioRad Radial C5-6 Nml Nml Nml Nml Nml 0 Nml Nml   Left Triceps Radial C6-7-8 Nml Nml Nml Nml Nml 0 Nml Nml   Left Deltoid Axillary C5-6 *Incr *3+ *3+ Nml Nml 0 *Reduced Nml   Left FlexPolLong Median (Ant Int) C7-8 Nml Nml Nml Nml Nml 0 Nml Nml     Nerve Conduction Studies Anti Sensory Left/Right Comparison   Stim Site L Lat (ms) R Lat (ms) L-R Lat (ms) L Amp (V) R Amp (V) L-R Amp (%) Site1 Site2 L Vel (m/s) R Vel (m/s) L-R Vel (m/s)  Median Acr Palm Anti Sensory (2nd Digit)  31.6C  Wrist 3.3   39.8   Wrist Palm     Palm 1.7   42.3         Radial Anti Sensory (Base 1st Digit)  32.1C  Wrist 2.3   19.7   Wrist Base 1st Digit     Ulnar Anti Sensory (5th Digit)  32.2C  Wrist 3.3   15.8   Wrist 5th Digit 42     Motor Left/Right Comparison   Stim Site L Lat (ms) R Lat (ms) L-R Lat (ms) L Amp (mV) R Amp (mV) L-R Amp (%) Site1 Site2 L Vel (m/s) R Vel (m/s) L-R Vel  (m/s)  Axillary Motor (Deltoid)  31.9C  Clavicle *16.3   2.7   Clavicle Deltoid 13    Median Motor (Abd Poll Brev)  32C  Wrist 3.1   5.9   Elbow Wrist 51    Elbow 6.8   5.9         Radial Motor (Ext Indicis)  32C  8cm 2.5   5.6   Up Arm 8cm 65    Up Arm 5.2   5.0         Ulnar Motor (Abd Dig Min)  31.9C  Wrist 2.7   8.6   B Elbow Wrist 60    B Elbow 5.7   7.2   A Elbow B Elbow 59    A Elbow 7.4   4.8            Waveforms:

## 2019-03-05 NOTE — Progress Notes (Signed)
Office Visit Note   Patient: Rebecca Davidson           Date of Birth: Oct 24, 1945           MRN: [id number] Visit Date: 03/05/2019              Requested by: No referring provider defined for this encounter. PCP: Patient, No Pcp Per   Assessment & Plan: Visit Diagnoses:  1. Dislocation of left shoulder joint, initial encounter   2. Injury of left axillary nerve, subsequent encounter     Plan: We discussed using a rope over the top of the door or pulley to do some assistive range of motion for shoulder.  She will proceed with MRI of her thumb as ordered by the hand surgeon.  I plan to check her back in 6 weeks.  We discussed the axillary nerve injury that occurred at the time of her shoulder dislocation.  She understands that this may take several months for it to gradually improve.  She states she really does not like to take medication we will try some Neurontin 100 mg at night 20 tablets prescribed to see if this gives her some relief.  If she tolerates it well we may gradually increase the dose.  Recheck 6 weeks.  Follow-Up Instructions: Return in about 1 month (around 04/04/2019).   Orders:  Orders Placed This Encounter  Procedures  . XR Shoulder Left   Meds ordered this encounter  Medications  . gabapentin (NEURONTIN) 100 MG capsule    Sig: Take 1 capsule (100 mg total) by mouth at bedtime.    Dispense:  20 capsule    Refill:  0-      Procedures: No procedures performed   Clinical Data: No additional findings.   Subjective: Chief Complaint  Patient presents with  . Left Shoulder - Pain, Fracture, Dislocation    DOI 12/18/2018    HPI 73 year old female returns post 12/18/2018 fall with shoulder dislocation and greater tuberosity fracture.  She has papillary nerve deficit both motor and sensory documented on EMGs nerve conduction velocities.  No evidence of brachial plexopathy.  She has an MRI pending on her thumb where she still is not able to flex her IP joint of her  thumb.  No median distal radial or ulnar defect in the left hand.  She has been doing some therapy and has about 30 to 45% passive range of motion at this point.  Review of Systems Reviewed updated and unchanged.  Objective: Vital Signs: BP 140/86   Pulse 76   Ht 5\' 2"  (1.575 m)   Wt 132 lb (59.9 kg)   BMI 24.14 kg/m   Physical Exam Constitutional:      Appearance: She is well-developed.  HENT:     Head: Normocephalic.     Right Ear: External ear normal.     Left Ear: External ear normal.  Eyes:     Pupils: Pupils are equal, round, and reactive to light.  Neck:     Thyroid: No thyromegaly.     Trachea: No tracheal deviation.  Cardiovascular:     Rate and Rhythm: Normal rate.  Pulmonary:     Effort: Pulmonary effort is normal.  Abdominal:     Palpations: Abdomen is soft.  Skin:    General: Skin is warm and dry.  Neurological:     Mental Status: She is alert and oriented to person, place, and time.  Psychiatric:        Behavior:  Behavior normal.     Ortho Exam patient has absent left axillary sensation over the lateral deltoid on the left with deltoid atrophy anterior posterior middle portions.  Elbow flexion is good wrist flexion extension is intact. Specialty Comments:  No specialty comments available.  Imaging: Xr Shoulder Left  Result Date: 03/05/2019 2 view x-rays left shoulder demonstrate previous tuberosity fracture.  Glenohumeral joint remains reduced.  Negative for acute changes in comparison to previous images. Impression: Post shoulder reduction with greater tuberosity fracture.  Glenohumeral joint is reduced.    PMFS History: Patient Active Problem List   Diagnosis Date Noted  . 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 Shoulder dislocation 12/25/2018   No past medical history on file.  No family history on file.  No past surgical history on file. Social History   Occupational History  . Not on file  Tobacco Use  . Smoking status: Never Smoker   . Smokeless tobacco: Never Used  Substance and Sexual Activity  . Alcohol use: Not Currently  . Drug use: Not on file  . Sexual activity: Not on file

## 2019-03-06 ENCOUNTER — Encounter: Payer: Self-pay | Admitting: Orthopaedic Surgery

## 2019-03-06 DIAGNOSIS — S4430XA Injury of axillary nerve, unspecified arm, initial encounter: Secondary | ICD-10-CM | POA: Insufficient documentation

## 2019-03-26 ENCOUNTER — Telehealth: Payer: Self-pay | Admitting: Orthopaedic Surgery

## 2019-03-26 NOTE — Telephone Encounter (Signed)
Patient called left voicemail message to return call concerning the medication Dr Lorin Mercy put her on (Neurontin)  The number to contact patient is [phone]

## 2019-03-26 NOTE — Telephone Encounter (Signed)
I called patient she wants to come off medication, only been taking for 2 weeks 100mg  at night only at bedtime. Patient wants to know does she need to gradually come off medication or can she just completely stop.  Please advise.

## 2019-03-26 NOTE — Telephone Encounter (Signed)
I called discussed.  

## 2019-03-27 ENCOUNTER — Other Ambulatory Visit: Payer: Self-pay

## 2019-03-27 ENCOUNTER — Encounter: Payer: Self-pay | Admitting: Family Medicine

## 2019-03-27 ENCOUNTER — Ambulatory Visit (INDEPENDENT_AMBULATORY_CARE_PROVIDER_SITE_OTHER): Payer: Medicare Other | Admitting: Family Medicine

## 2019-03-27 ENCOUNTER — Telehealth: Payer: Self-pay | Admitting: *Deleted

## 2019-03-27 VITALS — BP 140/86 | Wt 131.0 lb

## 2019-03-27 DIAGNOSIS — S4432XD Injury of axillary nerve, left arm, subsequent encounter: Secondary | ICD-10-CM

## 2019-03-27 DIAGNOSIS — N200 Calculus of kidney: Secondary | ICD-10-CM

## 2019-03-27 DIAGNOSIS — Z131 Encounter for screening for diabetes mellitus: Secondary | ICD-10-CM

## 2019-03-27 DIAGNOSIS — G43809 Other migraine, not intractable, without status migrainosus: Secondary | ICD-10-CM

## 2019-03-27 DIAGNOSIS — L659 Nonscarring hair loss, unspecified: Secondary | ICD-10-CM

## 2019-03-27 DIAGNOSIS — M8580 Other specified disorders of bone density and structure, unspecified site: Secondary | ICD-10-CM | POA: Insufficient documentation

## 2019-03-27 DIAGNOSIS — I10 Essential (primary) hypertension: Secondary | ICD-10-CM | POA: Diagnosis not present

## 2019-03-27 DIAGNOSIS — Z1211 Encounter for screening for malignant neoplasm of colon: Secondary | ICD-10-CM

## 2019-03-27 DIAGNOSIS — E559 Vitamin D deficiency, unspecified: Secondary | ICD-10-CM

## 2019-03-27 DIAGNOSIS — Z13220 Encounter for screening for lipoid disorders: Secondary | ICD-10-CM

## 2019-03-27 DIAGNOSIS — E538 Deficiency of other specified B group vitamins: Secondary | ICD-10-CM

## 2019-03-27 MED ORDER — PROPRANOLOL HCL ER 60 MG PO CP24: 60.0000 mg | ORAL_CAPSULE | Freq: Every day | ORAL | 1 refills | Status: DC

## 2019-03-27 MED ORDER — SUMATRIPTAN SUCCINATE 100 MG PO TABS: 100.0000 mg | ORAL_TABLET | Freq: Once | ORAL | 2 refills | Status: DC | PRN

## 2019-03-27 MED ORDER — LIDOCAINE 5 % EX OINT: TOPICAL_OINTMENT | CUTANEOUS | 2 refills | Status: DC

## 2019-03-27 NOTE — Progress Notes (Signed)
Virtual Visit via Video Note  I connected with Rebecca Davidson  on 03/27/19 at  2:30 PM EST by a video enabled telemedicine application and verified that I am speaking with the correct person using two identifiers.  Location patient: home Location provider:work office Persons participating in the virtual visit: patient, provider  I discussed the limitations of evaluation and management by telemedicine and the availability of in person appointments. The patient expressed understanding and agreed to proceed.   Rebecca Davidson DOB: 1945-07-14 Encounter date: 03/27/2019  This is a 73 y.o. female who presents to establish care. Chief Complaint  Patient presents with  . Establish Care    History of present illness: Just got here August 14th; then August 18th tripped and dislocated shoulder and fractured greater tuberosity. 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 Doing PT now; started 6 weeks ago. Starting to get some range of motion back. Nerve pain is bad. Tried neurontin but didn't help and didn't want to keep taking it. They think that nerve will recover, but should get better in the year. Can't drive because she can't turn the wheel. Has to depend on her daughter. Hurts right on deltoid. Also can't bend thumb. Has to wait evaluation for this because can't get in proper positioning with shoulder issue for MRI. So they are holding off on this until she can be positioned.   Taking the propranolol ER. Has had vertigo since age 73. Finally 2 years ago saw ENT and after some evaluation was dx with vestibular migraines. Had been on carvedilol and tried to elevate but got shaky and heart racing. When transferred to propranolol did great. No vertigo since starting. Migraines have been better but uses sumatriptan when she gets a migraine.   BP 140/86 at last doc visit. Thinks some of that is that she is in pain.   Has hx of kidney stones. Has had them three times. 2 times passed them without difficulty. Third time  ended up with imaging - they were able to retrieve. Drinks a lot of water and lemon to help.    Has noticed more hair coming out and more thinning in back in recent weeks. Not in front. Last bloodwork was a year ago.   Has thyroid nodules - has had biopsies. All were ok (states there were 9 and was discharged by endo last year).   Energy level hard to say; just not able to be active with shoulder.   Up to date with mammogram, bone density. Mammogram due Jan 2021. Colonoscopy is overdue.    Past Medical History:  Diagnosis Date  . Nephrolithiasis    History reviewed. No pertinent surgical history. Allergies  Allergen Reactions  . Myrbetriq [Mirabegron] Nausea Only  . Doxycycline Rash  . Sulfa Antibiotics Swelling and Rash   Current Meds  Medication Sig  . cholecalciferol (VITAMIN D3) 25 MCG (1000 UT) tablet Take 1,000 Units by mouth daily.  . Magnesium 250 MG TABS Take 250 mg by mouth daily.  . Multiple Vitamins-Minerals (CENTRUM ADULTS PO) Take 1 tablet by mouth daily.  . [DISCONTINUED] propranolol (INDERAL) 60 MG tablet Take 60 mg by mouth at bedtime.  . [DISCONTINUED] SUMAtriptan Succinate (IMITREX PO) Take 100 mg by mouth every 2 (two) hours as needed (headache, may take additional dose for relief, dont exceed more than 2/day).    Social History   Tobacco Use  . Smoking status: Never Smoker  . Smokeless tobacco: Never Used  Substance Use Topics  . Alcohol use: Not  Currently   Family History  Problem Relation Age of Onset  . Alzheimer's disease Mother   . Diabetes Mother   . Aneurysm Father   . Hypertension Sister   . Hyperlipidemia Sister      Review of Systems  Constitutional: Negative for chills, fatigue and fever.  Respiratory: Negative for cough, chest tightness, shortness of breath and wheezing.   Cardiovascular: Negative for chest pain, palpitations and leg swelling.  Musculoskeletal: Positive for arthralgias.  Neurological: Positive for weakness.     Objective:  BP 140/86 Comment: 3 weeks ago at orthopedic office-per patient-jaf  Wt 131 lb (59.4 kg)   BMI 23.96 kg/m   Weight: 131 lb (59.4 kg)   BP Readings from Last 3 Encounters:  03/27/19 140/86  03/05/19 140/86  01/15/19 (!) 157/94   Wt Readings from Last 3 Encounters:  03/27/19 131 lb (59.4 kg)  03/05/19 132 lb (59.9 kg)  01/15/19 135 lb (61.2 kg)    EXAM:  GENERAL: alert, oriented, appears well and in no acute distress  HEENT: atraumatic, conjunctiva clear, no obvious abnormalities on inspection of external nose and ears  NECK: normal movements of the head and neck  LUNGS: on inspection no signs of respiratory distress, breathing rate appears normal, no obvious gross SOB, gasping or wheezing  CV: no obvious cyanosis  MS: moves all visible extremities without noticeable abnormality  PSYCH/NEURO: pleasant and cooperative, no obvious depression or anxiety, speech and thought processing grossly intact  SKIN: No facial or neck abnormalities noted.  Assessment/Plan  1. Nephrolithiasis History of kidney stones.  Has done well avoiding use with hydration and daily citrus intake.  2. Injury of left axillary nerve, subsequent encounter Currently main source of worries.  She is working with physical therapy as well as orthopedics.  Due to pain, I did send in a topical cream to try which may help with nerve pain/inflammation since she is worried about taking oral medications.  She will let me know how she does with this.  3. Vestibular migraine Has been stable since starting propranolol.  Continue propranolol.  Imitrex if needed. - SUMAtriptan (IMITREX) 100 MG tablet; Take 1 tablet (100 mg total) by mouth once as needed for up to 1 dose for migraine (headache, may take additional dose for relief, dont exceed more than 2/day).  Dispense: 10 tablet; Refill: 2  4. Essential hypertension Blood pressure has been slightly elevated, but I suspect this is secondary to pain.   Have asked her to check at home (she will have her daughter check since she is unable to given current left shoulder situation) and she will let me know about numbers when we call with blood work results.  5. Screening for colon cancer She is due for colonoscopy, but last was normal.  We will do a Cologuard instead, and this will also help keep her out of the hospital system since Covid is significant currently. - Cologuard  6. Vitamin D deficiency We will recheck levels.  7. Osteopenia, unspecified location States that she had some improvement on last bone density.  We will request records when she is in the office.  Return for pending bloodwork/bp report.    I discussed the assessment and treatment plan with the patient. The patient was provided an opportunity to ask questions and all were answered. The patient agreed with the plan and demonstrated an understanding of the instructions.   The patient was advised to call back or seek an in-person evaluation if the symptoms worsen  or if the condition fails to improve as anticipated.  I provided 32 minutes of non-face-to-face time during this encounter.   Theodis Shove, MD

## 2019-03-27 NOTE — Telephone Encounter (Signed)
Left a detailed message at the pts home number to call the office for a lab appt.  Cologuard order faxed to [employer] at [phone] and a message was left with this info also.

## 2019-03-27 NOTE — Telephone Encounter (Signed)
-----   Message from Caren Macadam, MD sent at 03/27/2019  3:17 PM EST ----- Please schedule bloodwork.

## 2019-04-02 ENCOUNTER — Encounter: Payer: Self-pay | Admitting: Orthopaedic Surgery

## 2019-04-02 ENCOUNTER — Ambulatory Visit (INDEPENDENT_AMBULATORY_CARE_PROVIDER_SITE_OTHER): Payer: Medicare Other | Admitting: Orthopaedic Surgery

## 2019-04-02 ENCOUNTER — Other Ambulatory Visit: Payer: Self-pay

## 2019-04-02 VITALS — BP 131/84 | HR 73 | Ht 62.0 in | Wt 131.0 lb

## 2019-04-02 DIAGNOSIS — S43005D Unspecified dislocation of left shoulder joint, subsequent encounter: Secondary | ICD-10-CM

## 2019-04-02 DIAGNOSIS — S4432XS Injury of axillary nerve, left arm, sequela: Secondary | ICD-10-CM

## 2019-04-02 NOTE — Progress Notes (Signed)
Office Visit Note   Patient: Rebecca Davidson           Date of Birth: [birth year]           MRN: [id number] Visit Date: 04/02/2019              Requested by: No referring provider defined for this encounter. PCP: Caren Macadam, MD   Assessment & Plan: Visit Diagnoses:  1. Injury of left axillary nerve, sequela   2. Dislocation of left shoulder joint, subsequent encounter     Plan: She will continue with the range of motion exercises.  I discussed with her expect she will continue to get month-to-month improvement.  Recheck 3 months and 2 view x-rays left shoulder on return.  She is noted significant improvement in activities of daily living.  Follow-Up Instructions: Return in about 3 months (around 07/01/2019).   Orders:  No orders of the defined types were placed in this encounter.  No orders of the defined types were placed in this encounter.     Procedures: No procedures performed   Clinical Data: No additional findings.   Subjective: Chief Complaint  Patient presents with  . Left Shoulder - Follow-up    DOI 12/18/2018    HPI 73 year old female returns post shoulder dislocation greater tuberosity fracture with axillary nerve injury at the time of her dislocation.  She has noticed some improvement in sensation laterally to her shoulder.  She took the Neurontin for a while but only noticed that it made her sleep better but it really did not seem to make any difference.  She has noticed gradual week to week improvement in the pain that she had and states that her nerve pain is not as bad.  She is able to get her hand to posterior axillary line.  She can get her hand to the top of her head.  Therapy is significantly improved her range of motion.  Review of Systems 14 point systems updated unchanged from last office visit other than as mentioned in HPI.   Objective: Vital Signs: BP 131/84   Pulse 73   Ht 5\' 2"  (1.575 m)   Wt 131 lb (59.4 kg)   BMI 23.96 kg/m    Physical Exam Constitutional:      Appearance: She is well-developed.  HENT:     Head: Normocephalic.     Right Ear: External ear normal.     Left Ear: External ear normal.  Eyes:     Pupils: Pupils are equal, round, and reactive to light.  Neck:     Thyroid: No thyromegaly.     Trachea: No tracheal deviation.  Cardiovascular:     Rate and Rhythm: Normal rate.  Pulmonary:     Effort: Pulmonary effort is normal.  Abdominal:     Palpations: Abdomen is soft.  Skin:    General: Skin is warm and dry.  Neurological:     Mental Status: She is alert and oriented to person, place, and time.  Psychiatric:        Behavior: Behavior normal.     Ortho Exam patient has left deltoid atrophy.  She has trace sensation laterally over the skin over the deltoid..  Numbness does not extend as far down to the elbow is previous exam.  Good elbow flexion extension sensation and is intact.  Specialty Comments:  No specialty comments available.  Imaging: No results found.   PMFS History: Patient Active Problem List   Diagnosis Date Noted  .  Nephrolithiasis 03/27/2019  . Vitamin D deficiency 03/27/2019  . Osteopenia 03/27/2019  . 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 Shoulder dislocation 12/25/2018   Past Medical History:  Diagnosis Date  . Nephrolithiasis     Family History  Problem Relation Age of Onset  . Alzheimer's disease Mother   . Diabetes Mother   . Aneurysm Father   . Hypertension Sister   . Hyperlipidemia Sister     No past surgical history on file. Social History   Occupational History  . Not on file  Tobacco Use  . Smoking status: Never Smoker  . Smokeless tobacco: Never Used  Substance and Sexual Activity  . Alcohol use: Not Currently  . Drug use: Not on file  . Sexual activity: Not on file

## 2019-04-05 DIAGNOSIS — M25512 Pain in left shoulder: Secondary | ICD-10-CM | POA: Diagnosis not present

## 2019-04-05 DIAGNOSIS — M25812 Other specified joint disorders, left shoulder: Secondary | ICD-10-CM | POA: Diagnosis not present

## 2019-04-05 DIAGNOSIS — M25312 Other instability, left shoulder: Secondary | ICD-10-CM | POA: Diagnosis not present

## 2019-04-05 DIAGNOSIS — M25622 Stiffness of left elbow, not elsewhere classified: Secondary | ICD-10-CM | POA: Diagnosis not present

## 2019-04-08 ENCOUNTER — Other Ambulatory Visit (INDEPENDENT_AMBULATORY_CARE_PROVIDER_SITE_OTHER): Payer: Medicare Other

## 2019-04-08 ENCOUNTER — Other Ambulatory Visit: Payer: Self-pay

## 2019-04-08 DIAGNOSIS — E559 Vitamin D deficiency, unspecified: Secondary | ICD-10-CM | POA: Diagnosis not present

## 2019-04-08 DIAGNOSIS — L659 Nonscarring hair loss, unspecified: Secondary | ICD-10-CM | POA: Diagnosis not present

## 2019-04-08 DIAGNOSIS — I10 Essential (primary) hypertension: Secondary | ICD-10-CM | POA: Diagnosis not present

## 2019-04-08 DIAGNOSIS — E538 Deficiency of other specified B group vitamins: Secondary | ICD-10-CM | POA: Diagnosis not present

## 2019-04-08 DIAGNOSIS — Z131 Encounter for screening for diabetes mellitus: Secondary | ICD-10-CM | POA: Diagnosis not present

## 2019-04-08 DIAGNOSIS — Z13220 Encounter for screening for lipoid disorders: Secondary | ICD-10-CM | POA: Diagnosis not present

## 2019-04-08 LAB — VITAMIN D 25 HYDROXY (VIT D DEFICIENCY, FRACTURES): VITD: 51.8 ng/mL (ref 30.00–100.00)

## 2019-04-08 LAB — CBC WITH DIFFERENTIAL/PLATELET
Basophils Absolute: 0.1 10*3/uL (ref 0.0–0.1)
Basophils Relative: 0.6 % (ref 0.0–3.0)
Eosinophils Absolute: 0.1 10*3/uL (ref 0.0–0.7)
Eosinophils Relative: 0.9 % (ref 0.0–5.0)
HCT: 40.1 % (ref 36.0–46.0)
Hemoglobin: 13.5 g/dL (ref 12.0–15.0)
Lymphocytes Relative: 46.5 % — ABNORMAL HIGH (ref 12.0–46.0)
Lymphs Abs: 3.9 10*3/uL (ref 0.7–4.0)
MCHC: 33.7 g/dL (ref 30.0–36.0)
MCV: 90.9 fl (ref 78.0–100.0)
Monocytes Absolute: 0.5 10*3/uL (ref 0.1–1.0)
Monocytes Relative: 6 % (ref 3.0–12.0)
Neutro Abs: 3.9 10*3/uL (ref 1.4–7.7)
Neutrophils Relative %: 46 % (ref 43.0–77.0)
Platelets: 314 10*3/uL (ref 150.0–400.0)
RBC: 4.42 Mil/uL (ref 3.87–5.11)
RDW: 12.2 % (ref 11.5–15.5)
WBC: 8.5 10*3/uL (ref 4.0–10.5)

## 2019-04-08 LAB — COMPREHENSIVE METABOLIC PANEL
ALT: 12 U/L (ref 0–35)
AST: 18 U/L (ref 0–37)
Albumin: 4.2 g/dL (ref 3.5–5.2)
Alkaline Phosphatase: 83 U/L (ref 39–117)
BUN: 16 mg/dL (ref 6–23)
CO2: 28 mEq/L (ref 19–32)
Calcium: 9.9 mg/dL (ref 8.4–10.5)
Chloride: 102 mEq/L (ref 96–112)
Creatinine, Ser: 0.66 mg/dL (ref 0.40–1.20)
GFR: 87.49 mL/min (ref >60.00)
Glucose, Bld: 100 mg/dL — ABNORMAL HIGH (ref 70–99)
Potassium: 4.3 mEq/L (ref 3.5–5.1)
Sodium: 139 mEq/L (ref 135–145)
Total Bilirubin: 0.8 mg/dL (ref 0.2–1.2)
Total Protein: 6.7 g/dL (ref 6.0–8.3)

## 2019-04-08 LAB — LIPID PANEL
Cholesterol: 231 mg/dL — ABNORMAL HIGH (ref 0–200)
HDL: 77.2 mg/dL (ref >39.00)
LDL Cholesterol: 135 mg/dL — ABNORMAL HIGH (ref 0–99)
NonHDL: 153.37
Total CHOL/HDL Ratio: 3
Triglycerides: 92 mg/dL (ref 0.0–149.0)
VLDL: 18.4 mg/dL (ref 0.0–40.0)

## 2019-04-08 LAB — HEMOGLOBIN A1C: Hgb A1c MFr Bld: 5.7 % (ref 4.6–6.5)

## 2019-04-08 LAB — IBC + FERRITIN
Ferritin: 165 ng/mL (ref 10.0–291.0)
Iron: 77 ug/dL (ref 42–145)
Saturation Ratios: 25.1 % (ref 20.0–50.0)
Transferrin: 219 mg/dL (ref 212.0–360.0)

## 2019-04-08 LAB — TSH: TSH: 1.05 u[IU]/mL (ref 0.35–4.50)

## 2019-04-08 LAB — VITAMIN B12: Vitamin B-12: 414 pg/mL (ref 211–911)

## 2019-04-09 ENCOUNTER — Ambulatory Visit: Payer: 59 | Admitting: Orthopaedic Surgery

## 2019-04-09 DIAGNOSIS — M25622 Stiffness of left elbow, not elsewhere classified: Secondary | ICD-10-CM | POA: Diagnosis not present

## 2019-04-09 DIAGNOSIS — M25812 Other specified joint disorders, left shoulder: Secondary | ICD-10-CM | POA: Diagnosis not present

## 2019-04-09 DIAGNOSIS — M25512 Pain in left shoulder: Secondary | ICD-10-CM | POA: Diagnosis not present

## 2019-04-09 DIAGNOSIS — M25312 Other instability, left shoulder: Secondary | ICD-10-CM | POA: Diagnosis not present

## 2019-04-12 DIAGNOSIS — Z1211 Encounter for screening for malignant neoplasm of colon: Secondary | ICD-10-CM | POA: Diagnosis not present

## 2019-04-12 DIAGNOSIS — M25812 Other specified joint disorders, left shoulder: Secondary | ICD-10-CM | POA: Diagnosis not present

## 2019-04-12 DIAGNOSIS — M25512 Pain in left shoulder: Secondary | ICD-10-CM | POA: Diagnosis not present

## 2019-04-12 DIAGNOSIS — M25622 Stiffness of left elbow, not elsewhere classified: Secondary | ICD-10-CM | POA: Diagnosis not present

## 2019-04-12 DIAGNOSIS — M25312 Other instability, left shoulder: Secondary | ICD-10-CM | POA: Diagnosis not present

## 2019-04-12 LAB — COLOGUARD: Cologuard: NEGATIVE

## 2019-04-14 ENCOUNTER — Encounter: Payer: Self-pay | Admitting: Family Medicine

## 2019-04-14 DIAGNOSIS — E042 Nontoxic multinodular goiter: Secondary | ICD-10-CM | POA: Insufficient documentation

## 2019-04-15 ENCOUNTER — Encounter: Payer: Self-pay | Admitting: Family Medicine

## 2019-04-16 DIAGNOSIS — M25312 Other instability, left shoulder: Secondary | ICD-10-CM | POA: Diagnosis not present

## 2019-04-16 DIAGNOSIS — M25512 Pain in left shoulder: Secondary | ICD-10-CM | POA: Diagnosis not present

## 2019-04-16 DIAGNOSIS — M25812 Other specified joint disorders, left shoulder: Secondary | ICD-10-CM | POA: Diagnosis not present

## 2019-04-16 DIAGNOSIS — M25622 Stiffness of left elbow, not elsewhere classified: Secondary | ICD-10-CM | POA: Diagnosis not present

## 2019-04-19 DIAGNOSIS — M25812 Other specified joint disorders, left shoulder: Secondary | ICD-10-CM | POA: Diagnosis not present

## 2019-04-19 DIAGNOSIS — M25512 Pain in left shoulder: Secondary | ICD-10-CM | POA: Diagnosis not present

## 2019-04-19 DIAGNOSIS — M25622 Stiffness of left elbow, not elsewhere classified: Secondary | ICD-10-CM | POA: Diagnosis not present

## 2019-04-19 DIAGNOSIS — M25312 Other instability, left shoulder: Secondary | ICD-10-CM | POA: Diagnosis not present

## 2019-04-22 DIAGNOSIS — M25312 Other instability, left shoulder: Secondary | ICD-10-CM | POA: Diagnosis not present

## 2019-04-22 DIAGNOSIS — M25812 Other specified joint disorders, left shoulder: Secondary | ICD-10-CM | POA: Diagnosis not present

## 2019-04-22 DIAGNOSIS — M25512 Pain in left shoulder: Secondary | ICD-10-CM | POA: Diagnosis not present

## 2019-04-22 DIAGNOSIS — M25622 Stiffness of left elbow, not elsewhere classified: Secondary | ICD-10-CM | POA: Diagnosis not present

## 2019-04-23 ENCOUNTER — Encounter: Payer: Self-pay | Admitting: Family Medicine

## 2019-04-23 ENCOUNTER — Telehealth: Payer: Self-pay | Admitting: *Deleted

## 2019-04-23 NOTE — Telephone Encounter (Signed)
Detailed message was left previously stating the results of the Cologuard was negative.

## 2019-04-23 NOTE — Telephone Encounter (Signed)
Copied from [place] [phone]. Topic: General - Other >> Apr 23, 2019  9:56 AM Yvette Rack wrote: Reason for CRM: Pt stated she had a missed call from the office. Pt requests call back

## 2019-04-25 ENCOUNTER — Other Ambulatory Visit: Payer: Self-pay | Admitting: Family Medicine

## 2019-04-25 DIAGNOSIS — M25812 Other specified joint disorders, left shoulder: Secondary | ICD-10-CM | POA: Diagnosis not present

## 2019-04-25 DIAGNOSIS — M25622 Stiffness of left elbow, not elsewhere classified: Secondary | ICD-10-CM | POA: Diagnosis not present

## 2019-04-25 DIAGNOSIS — G43809 Other migraine, not intractable, without status migrainosus: Secondary | ICD-10-CM

## 2019-04-25 DIAGNOSIS — M25512 Pain in left shoulder: Secondary | ICD-10-CM | POA: Diagnosis not present

## 2019-04-25 DIAGNOSIS — M25312 Other instability, left shoulder: Secondary | ICD-10-CM | POA: Diagnosis not present

## 2019-04-26 DIAGNOSIS — M62512 Muscle wasting and atrophy, not elsewhere classified, left shoulder: Secondary | ICD-10-CM | POA: Diagnosis not present

## 2019-04-29 DIAGNOSIS — M25622 Stiffness of left elbow, not elsewhere classified: Secondary | ICD-10-CM | POA: Diagnosis not present

## 2019-04-29 DIAGNOSIS — M25312 Other instability, left shoulder: Secondary | ICD-10-CM | POA: Diagnosis not present

## 2019-04-29 DIAGNOSIS — M25812 Other specified joint disorders, left shoulder: Secondary | ICD-10-CM | POA: Diagnosis not present

## 2019-04-29 DIAGNOSIS — M25512 Pain in left shoulder: Secondary | ICD-10-CM | POA: Diagnosis not present

## 2019-05-02 DIAGNOSIS — M25512 Pain in left shoulder: Secondary | ICD-10-CM | POA: Diagnosis not present

## 2019-05-02 DIAGNOSIS — M25622 Stiffness of left elbow, not elsewhere classified: Secondary | ICD-10-CM | POA: Diagnosis not present

## 2019-05-02 DIAGNOSIS — M25812 Other specified joint disorders, left shoulder: Secondary | ICD-10-CM | POA: Diagnosis not present

## 2019-05-02 DIAGNOSIS — M25312 Other instability, left shoulder: Secondary | ICD-10-CM | POA: Diagnosis not present

## 2019-05-06 ENCOUNTER — Encounter: Payer: Self-pay | Admitting: Family Medicine

## 2019-05-07 ENCOUNTER — Encounter: Payer: Self-pay | Admitting: Family Medicine

## 2019-05-07 DIAGNOSIS — M25312 Other instability, left shoulder: Secondary | ICD-10-CM | POA: Diagnosis not present

## 2019-05-07 DIAGNOSIS — M25812 Other specified joint disorders, left shoulder: Secondary | ICD-10-CM | POA: Diagnosis not present

## 2019-05-07 DIAGNOSIS — M25512 Pain in left shoulder: Secondary | ICD-10-CM | POA: Diagnosis not present

## 2019-05-07 DIAGNOSIS — M25622 Stiffness of left elbow, not elsewhere classified: Secondary | ICD-10-CM | POA: Diagnosis not present

## 2019-05-09 DIAGNOSIS — M25512 Pain in left shoulder: Secondary | ICD-10-CM | POA: Diagnosis not present

## 2019-05-09 DIAGNOSIS — M25812 Other specified joint disorders, left shoulder: Secondary | ICD-10-CM | POA: Diagnosis not present

## 2019-05-09 DIAGNOSIS — M25622 Stiffness of left elbow, not elsewhere classified: Secondary | ICD-10-CM | POA: Diagnosis not present

## 2019-05-09 DIAGNOSIS — M25312 Other instability, left shoulder: Secondary | ICD-10-CM | POA: Diagnosis not present

## 2019-05-14 DIAGNOSIS — M25312 Other instability, left shoulder: Secondary | ICD-10-CM | POA: Diagnosis not present

## 2019-05-14 DIAGNOSIS — M25812 Other specified joint disorders, left shoulder: Secondary | ICD-10-CM | POA: Diagnosis not present

## 2019-05-14 DIAGNOSIS — M25622 Stiffness of left elbow, not elsewhere classified: Secondary | ICD-10-CM | POA: Diagnosis not present

## 2019-05-14 DIAGNOSIS — M25512 Pain in left shoulder: Secondary | ICD-10-CM | POA: Diagnosis not present

## 2019-05-16 ENCOUNTER — Telehealth: Payer: Self-pay | Admitting: *Deleted

## 2019-05-16 NOTE — Telephone Encounter (Signed)
See abstraction.   

## 2019-05-16 NOTE — Telephone Encounter (Signed)
-----   Message from Wynn Banker, MD sent at 05/11/2019 12:18 PM EST ----- Was cologuard received? I don't see results; just checking in. Please resend order if not received.  ----- Message ----- From: SYSTEM Sent: 04/01/2019  12:04 AM EST To: Wynn Banker, MD

## 2019-05-17 DIAGNOSIS — M25312 Other instability, left shoulder: Secondary | ICD-10-CM | POA: Diagnosis not present

## 2019-05-17 DIAGNOSIS — M25512 Pain in left shoulder: Secondary | ICD-10-CM | POA: Diagnosis not present

## 2019-05-17 DIAGNOSIS — M25812 Other specified joint disorders, left shoulder: Secondary | ICD-10-CM | POA: Diagnosis not present

## 2019-05-17 DIAGNOSIS — M25622 Stiffness of left elbow, not elsewhere classified: Secondary | ICD-10-CM | POA: Diagnosis not present

## 2019-05-21 DIAGNOSIS — M25622 Stiffness of left elbow, not elsewhere classified: Secondary | ICD-10-CM | POA: Diagnosis not present

## 2019-05-21 DIAGNOSIS — M25312 Other instability, left shoulder: Secondary | ICD-10-CM | POA: Diagnosis not present

## 2019-05-21 DIAGNOSIS — M25812 Other specified joint disorders, left shoulder: Secondary | ICD-10-CM | POA: Diagnosis not present

## 2019-05-21 DIAGNOSIS — M25512 Pain in left shoulder: Secondary | ICD-10-CM | POA: Diagnosis not present

## 2019-05-24 DIAGNOSIS — M25812 Other specified joint disorders, left shoulder: Secondary | ICD-10-CM | POA: Diagnosis not present

## 2019-05-24 DIAGNOSIS — M25622 Stiffness of left elbow, not elsewhere classified: Secondary | ICD-10-CM | POA: Diagnosis not present

## 2019-05-24 DIAGNOSIS — M25512 Pain in left shoulder: Secondary | ICD-10-CM | POA: Diagnosis not present

## 2019-05-24 DIAGNOSIS — M25312 Other instability, left shoulder: Secondary | ICD-10-CM | POA: Diagnosis not present

## 2019-05-27 DIAGNOSIS — M62512 Muscle wasting and atrophy, not elsewhere classified, left shoulder: Secondary | ICD-10-CM | POA: Diagnosis not present

## 2019-05-28 DIAGNOSIS — M25812 Other specified joint disorders, left shoulder: Secondary | ICD-10-CM | POA: Diagnosis not present

## 2019-05-28 DIAGNOSIS — M25622 Stiffness of left elbow, not elsewhere classified: Secondary | ICD-10-CM | POA: Diagnosis not present

## 2019-05-28 DIAGNOSIS — M25312 Other instability, left shoulder: Secondary | ICD-10-CM | POA: Diagnosis not present

## 2019-05-28 DIAGNOSIS — M25512 Pain in left shoulder: Secondary | ICD-10-CM | POA: Diagnosis not present

## 2019-05-31 DIAGNOSIS — M25512 Pain in left shoulder: Secondary | ICD-10-CM | POA: Diagnosis not present

## 2019-05-31 DIAGNOSIS — M25312 Other instability, left shoulder: Secondary | ICD-10-CM | POA: Diagnosis not present

## 2019-05-31 DIAGNOSIS — M25812 Other specified joint disorders, left shoulder: Secondary | ICD-10-CM | POA: Diagnosis not present

## 2019-05-31 DIAGNOSIS — M25622 Stiffness of left elbow, not elsewhere classified: Secondary | ICD-10-CM | POA: Diagnosis not present

## 2019-06-04 DIAGNOSIS — M25312 Other instability, left shoulder: Secondary | ICD-10-CM | POA: Diagnosis not present

## 2019-06-04 DIAGNOSIS — M25622 Stiffness of left elbow, not elsewhere classified: Secondary | ICD-10-CM | POA: Diagnosis not present

## 2019-06-04 DIAGNOSIS — M25512 Pain in left shoulder: Secondary | ICD-10-CM | POA: Diagnosis not present

## 2019-06-04 DIAGNOSIS — M25812 Other specified joint disorders, left shoulder: Secondary | ICD-10-CM | POA: Diagnosis not present

## 2019-06-07 DIAGNOSIS — M25312 Other instability, left shoulder: Secondary | ICD-10-CM | POA: Diagnosis not present

## 2019-06-07 DIAGNOSIS — M25622 Stiffness of left elbow, not elsewhere classified: Secondary | ICD-10-CM | POA: Diagnosis not present

## 2019-06-07 DIAGNOSIS — M25512 Pain in left shoulder: Secondary | ICD-10-CM | POA: Diagnosis not present

## 2019-06-07 DIAGNOSIS — M25812 Other specified joint disorders, left shoulder: Secondary | ICD-10-CM | POA: Diagnosis not present

## 2019-06-08 ENCOUNTER — Ambulatory Visit: Payer: Medicare Other | Attending: Internal Medicine

## 2019-06-08 DIAGNOSIS — Z23 Encounter for immunization: Secondary | ICD-10-CM | POA: Insufficient documentation

## 2019-06-08 NOTE — Progress Notes (Signed)
   Covid-19 Vaccination Clinic  Name:  Rebecca Davidson    MRN: [id number] DOB: 03-31-1946  06/08/2019  Rebecca Davidson was observed post Covid-19 immunization for 15 minutes without incidence. She was provided with Vaccine Information Sheet and instruction to access the V-Safe system.   Rebecca Davidson was instructed to call 911 with any severe reactions post vaccine: Marland Kitchen Difficulty breathing  . Swelling of your face and throat  . A fast heartbeat  . A bad rash all over your body  . Dizziness and weakness    Immunizations Administered    Name Date Dose VIS Date Route   Pfizer COVID-19 Vaccine 06/08/2019  4:58 PM 0.3 mL 04/12/2019 Intramuscular   Manufacturer: [employer], [employer]   Lot: [id number]   NDC: [address]-0

## 2019-06-11 DIAGNOSIS — M25512 Pain in left shoulder: Secondary | ICD-10-CM | POA: Diagnosis not present

## 2019-06-11 DIAGNOSIS — M25312 Other instability, left shoulder: Secondary | ICD-10-CM | POA: Diagnosis not present

## 2019-06-11 DIAGNOSIS — M25812 Other specified joint disorders, left shoulder: Secondary | ICD-10-CM | POA: Diagnosis not present

## 2019-06-11 DIAGNOSIS — M25622 Stiffness of left elbow, not elsewhere classified: Secondary | ICD-10-CM | POA: Diagnosis not present

## 2019-06-18 DIAGNOSIS — M25512 Pain in left shoulder: Secondary | ICD-10-CM | POA: Diagnosis not present

## 2019-06-18 DIAGNOSIS — M25812 Other specified joint disorders, left shoulder: Secondary | ICD-10-CM | POA: Diagnosis not present

## 2019-06-18 DIAGNOSIS — M25312 Other instability, left shoulder: Secondary | ICD-10-CM | POA: Diagnosis not present

## 2019-06-18 DIAGNOSIS — M25622 Stiffness of left elbow, not elsewhere classified: Secondary | ICD-10-CM | POA: Diagnosis not present

## 2019-06-21 DIAGNOSIS — M25512 Pain in left shoulder: Secondary | ICD-10-CM | POA: Diagnosis not present

## 2019-06-21 DIAGNOSIS — M25622 Stiffness of left elbow, not elsewhere classified: Secondary | ICD-10-CM | POA: Diagnosis not present

## 2019-06-21 DIAGNOSIS — M25812 Other specified joint disorders, left shoulder: Secondary | ICD-10-CM | POA: Diagnosis not present

## 2019-06-21 DIAGNOSIS — M25312 Other instability, left shoulder: Secondary | ICD-10-CM | POA: Diagnosis not present

## 2019-06-25 DIAGNOSIS — M25512 Pain in left shoulder: Secondary | ICD-10-CM | POA: Diagnosis not present

## 2019-06-25 DIAGNOSIS — M25312 Other instability, left shoulder: Secondary | ICD-10-CM | POA: Diagnosis not present

## 2019-06-25 DIAGNOSIS — M25622 Stiffness of left elbow, not elsewhere classified: Secondary | ICD-10-CM | POA: Diagnosis not present

## 2019-06-25 DIAGNOSIS — M25812 Other specified joint disorders, left shoulder: Secondary | ICD-10-CM | POA: Diagnosis not present

## 2019-06-26 ENCOUNTER — Telehealth: Payer: Self-pay | Admitting: Family Medicine

## 2019-06-26 ENCOUNTER — Ambulatory Visit: Payer: Medicare Other

## 2019-06-26 MED ORDER — PROPRANOLOL HCL ER 60 MG PO CP24: 60.0000 mg | ORAL_CAPSULE | Freq: Every day | ORAL | 0 refills | Status: DC

## 2019-06-26 NOTE — Telephone Encounter (Signed)
  pt requesting a refill  propranolol ER (INDERAL LA) 60 MG 24 hr capsule   please send  to [facility]  [address] [phone] contact number  [phone] (H)

## 2019-06-26 NOTE — Telephone Encounter (Signed)
Rx done. 

## 2019-06-27 DIAGNOSIS — M62512 Muscle wasting and atrophy, not elsewhere classified, left shoulder: Secondary | ICD-10-CM | POA: Diagnosis not present

## 2019-06-28 DIAGNOSIS — M25812 Other specified joint disorders, left shoulder: Secondary | ICD-10-CM | POA: Diagnosis not present

## 2019-06-28 DIAGNOSIS — M25512 Pain in left shoulder: Secondary | ICD-10-CM | POA: Diagnosis not present

## 2019-06-28 DIAGNOSIS — M25312 Other instability, left shoulder: Secondary | ICD-10-CM | POA: Diagnosis not present

## 2019-06-28 DIAGNOSIS — M25622 Stiffness of left elbow, not elsewhere classified: Secondary | ICD-10-CM | POA: Diagnosis not present

## 2019-06-29 ENCOUNTER — Other Ambulatory Visit: Payer: Self-pay | Admitting: Family Medicine

## 2019-07-01 DIAGNOSIS — M25312 Other instability, left shoulder: Secondary | ICD-10-CM | POA: Diagnosis not present

## 2019-07-01 DIAGNOSIS — M25812 Other specified joint disorders, left shoulder: Secondary | ICD-10-CM | POA: Diagnosis not present

## 2019-07-01 DIAGNOSIS — M25622 Stiffness of left elbow, not elsewhere classified: Secondary | ICD-10-CM | POA: Diagnosis not present

## 2019-07-01 DIAGNOSIS — M25512 Pain in left shoulder: Secondary | ICD-10-CM | POA: Diagnosis not present

## 2019-07-02 ENCOUNTER — Ambulatory Visit: Payer: Medicare Other | Admitting: Orthopaedic Surgery

## 2019-07-03 ENCOUNTER — Ambulatory Visit: Payer: Medicare Other | Attending: Internal Medicine

## 2019-07-03 DIAGNOSIS — Z23 Encounter for immunization: Secondary | ICD-10-CM | POA: Insufficient documentation

## 2019-07-03 NOTE — Progress Notes (Signed)
   Covid-19 Vaccination Clinic  Name:  Rebecca Davidson    MRN: [id number] DOB: [birth year]  07/03/2019  Rebecca Davidson was observed post Covid-19 immunization for 15 minutes without incident. She was provided with Vaccine Information Sheet and instruction to access the V-Safe system.   Rebecca Davidson was instructed to call 911 with any severe reactions post vaccine: Marland Kitchen Difficulty breathing  . Swelling of face and throat  . A fast heartbeat  . A bad rash all over body  . Dizziness and weakness   Immunizations Administered    Name Date Dose VIS Date Route   Pfizer COVID-19 Vaccine 07/03/2019  1:32 PM 0.3 mL 04/12/2019 Intramuscular   Manufacturer: [employer], [employer]   Lot: [id number]   NDC: [address]-1

## 2019-07-08 DIAGNOSIS — M25812 Other specified joint disorders, left shoulder: Secondary | ICD-10-CM | POA: Diagnosis not present

## 2019-07-08 DIAGNOSIS — M25312 Other instability, left shoulder: Secondary | ICD-10-CM | POA: Diagnosis not present

## 2019-07-08 DIAGNOSIS — M25622 Stiffness of left elbow, not elsewhere classified: Secondary | ICD-10-CM | POA: Diagnosis not present

## 2019-07-08 DIAGNOSIS — M25512 Pain in left shoulder: Secondary | ICD-10-CM | POA: Diagnosis not present

## 2019-07-09 ENCOUNTER — Ambulatory Visit: Payer: Self-pay

## 2019-07-09 ENCOUNTER — Ambulatory Visit: Payer: Medicare Other | Admitting: Orthopaedic Surgery

## 2019-07-09 ENCOUNTER — Encounter: Payer: Self-pay | Admitting: Orthopaedic Surgery

## 2019-07-09 ENCOUNTER — Other Ambulatory Visit: Payer: Self-pay

## 2019-07-09 VITALS — BP 125/79 | HR 68 | Ht 62.0 in | Wt 131.0 lb

## 2019-07-09 DIAGNOSIS — S43005D Unspecified dislocation of left shoulder joint, subsequent encounter: Secondary | ICD-10-CM | POA: Diagnosis not present

## 2019-07-09 DIAGNOSIS — R6889 Other general symptoms and signs: Secondary | ICD-10-CM | POA: Diagnosis not present

## 2019-07-09 NOTE — Progress Notes (Signed)
Office Visit Note   Patient: Rebecca Davidson           Date of Birth: 10/27/1945           MRN: [id number] Visit Date: 07/09/2019              Requested by: Wynn Banker, MD [address] [place],  [place] [address] PCP: Wynn Banker, MD   Assessment & Plan: Visit Diagnoses:  1. Dislocation of left shoulder joint, subsequent encounter     Plan: She will continue to work on strengthening and range of motion.  I plan to recheck her again in 4 months.  We discussed she should continue to get some gradual increase in strength. Follow-Up Instructions: Return in about 4 months (around 11/08/2019).   Orders:  Orders Placed This Encounter  Procedures  . XR Shoulder Left   No orders of the defined types were placed in this encounter.     Procedures: No procedures performed   Clinical Data: No additional findings.   Subjective: Chief Complaint  Patient presents with  . Left Shoulder - Follow-up    DOI 12/18/2018    HPI 74 year old female returns post shoulder dislocation 12/18/2018 with still some pain in her shoulder occasional sharp pains.  She has gotten some improvement in the sensation laterally over shoulder in the axillary sensory distribution.  She is able to fire the deltoid but still has weakness can only abduct about 75 to 80 degrees with pain.  She is using her arm better down low in front of her.  She is continue to work on some range of motion but has problems trying to get past 100 degrees.  Patient had shoulder dislocation with axillary nerve injury with now progressive improvement in axillary nerve sensory and motor function.  Review of Systems 14 point unchanged.   Objective: Vital Signs: BP 125/79   Pulse 68   Ht 5\' 2"  (1.575 m)   Wt 131 lb (59.4 kg)   BMI 23.96 kg/m   Physical Exam Constitutional:      Appearance: She is well-developed.  HENT:     Head: Normocephalic.     Right Ear: External ear normal.     Left Ear: External ear  normal.  Eyes:     Pupils: Pupils are equal, round, and reactive to light.  Neck:     Thyroid: No thyromegaly.     Trachea: No tracheal deviation.  Cardiovascular:     Rate and Rhythm: Normal rate.  Pulmonary:     Effort: Pulmonary effort is normal.  Abdominal:     Palpations: Abdomen is soft.  Skin:    General: Skin is warm and dry.  Neurological:     Mental Status: She is alert and oriented to person, place, and time.  Psychiatric:        Behavior: Behavior normal.     Ortho Exam with patient's arm at her side she can contract the deltoid and generate pressure.  She is set 4 - strength of the deltoid on the left.  Right side is normal.  She can be abducted to 100 degrees actively she makes it to about 80 degrees.  Elbow reaches full extension sensation her hand is intact.  Specialty Comments:  No specialty comments available.  Imaging: XR Shoulder Left  Result Date: 07/09/2019 Three-view x-rays left shoulder obtained and reviewed.  This shows the shoulder is reduced.  Greater tuberosity fracture with displacement unchanged from previous images. Impression: Post  fracture dislocation left shoulder with greater tuberosity fracture unchanged in position with displacement.    PMFS History: Patient Active Problem List   Diagnosis Date Noted  . Multiple thyroid nodules 04/14/2019  . Nephrolithiasis 03/27/2019  . Vitamin D deficiency 03/27/2019  . Osteopenia 03/27/2019  . 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 Shoulder dislocation 12/25/2018   Past Medical History:  Diagnosis Date  . Nephrolithiasis     Family History  Problem Relation Age of Onset  . Alzheimer's disease Mother   . Diabetes Mother   . Aneurysm Father   . Hypertension Sister   . Hyperlipidemia Sister     No past surgical history on file. Social History   Occupational History  . Not on file  Tobacco Use  . Smoking status: Never Smoker  . Smokeless tobacco: Never Used  Substance and Sexual  Activity  . Alcohol use: Not Currently  . Drug use: Not on file  . Sexual activity: Not on file

## 2019-07-11 DIAGNOSIS — M25312 Other instability, left shoulder: Secondary | ICD-10-CM | POA: Diagnosis not present

## 2019-07-11 DIAGNOSIS — M25512 Pain in left shoulder: Secondary | ICD-10-CM | POA: Diagnosis not present

## 2019-07-11 DIAGNOSIS — M25812 Other specified joint disorders, left shoulder: Secondary | ICD-10-CM | POA: Diagnosis not present

## 2019-07-11 DIAGNOSIS — M25622 Stiffness of left elbow, not elsewhere classified: Secondary | ICD-10-CM | POA: Diagnosis not present

## 2019-07-15 DIAGNOSIS — M25622 Stiffness of left elbow, not elsewhere classified: Secondary | ICD-10-CM | POA: Diagnosis not present

## 2019-07-15 DIAGNOSIS — M25512 Pain in left shoulder: Secondary | ICD-10-CM | POA: Diagnosis not present

## 2019-07-15 DIAGNOSIS — M25312 Other instability, left shoulder: Secondary | ICD-10-CM | POA: Diagnosis not present

## 2019-07-15 DIAGNOSIS — M25812 Other specified joint disorders, left shoulder: Secondary | ICD-10-CM | POA: Diagnosis not present

## 2019-07-22 DIAGNOSIS — M25812 Other specified joint disorders, left shoulder: Secondary | ICD-10-CM | POA: Diagnosis not present

## 2019-07-22 DIAGNOSIS — M25622 Stiffness of left elbow, not elsewhere classified: Secondary | ICD-10-CM | POA: Diagnosis not present

## 2019-07-22 DIAGNOSIS — M25312 Other instability, left shoulder: Secondary | ICD-10-CM | POA: Diagnosis not present

## 2019-07-22 DIAGNOSIS — M25512 Pain in left shoulder: Secondary | ICD-10-CM | POA: Diagnosis not present

## 2019-07-25 DIAGNOSIS — M62512 Muscle wasting and atrophy, not elsewhere classified, left shoulder: Secondary | ICD-10-CM | POA: Diagnosis not present

## 2019-07-25 DIAGNOSIS — M25512 Pain in left shoulder: Secondary | ICD-10-CM | POA: Diagnosis not present

## 2019-07-25 DIAGNOSIS — M25622 Stiffness of left elbow, not elsewhere classified: Secondary | ICD-10-CM | POA: Diagnosis not present

## 2019-07-25 DIAGNOSIS — M25812 Other specified joint disorders, left shoulder: Secondary | ICD-10-CM | POA: Diagnosis not present

## 2019-07-25 DIAGNOSIS — M25312 Other instability, left shoulder: Secondary | ICD-10-CM | POA: Diagnosis not present

## 2019-08-25 DIAGNOSIS — M62512 Muscle wasting and atrophy, not elsewhere classified, left shoulder: Secondary | ICD-10-CM | POA: Diagnosis not present

## 2019-08-29 ENCOUNTER — Other Ambulatory Visit: Payer: Self-pay

## 2019-08-30 ENCOUNTER — Encounter: Payer: Self-pay | Admitting: Family Medicine

## 2019-08-30 ENCOUNTER — Ambulatory Visit (INDEPENDENT_AMBULATORY_CARE_PROVIDER_SITE_OTHER): Payer: Medicare Other | Admitting: Family Medicine

## 2019-08-30 VITALS — BP 122/68 | HR 86 | Temp 97.3°F | Wt 138.2 lb

## 2019-08-30 DIAGNOSIS — M25562 Pain in left knee: Secondary | ICD-10-CM

## 2019-08-30 NOTE — Progress Notes (Signed)
   Subjective:    Patient ID: Rebecca Davidson, female    DOB: Apr 15, 1946, 74 y.o.   MRN: [id number]  HPI Here for pain in the medial left knee that started 3 weeks ago when she got out of bed and stood up. The pain has continued since then, though not as intense. The knee never swelled. There is no locking or giving way. She has treated this with anything as yet.    Review of Systems  Constitutional: Negative.   Respiratory: Negative.   Cardiovascular: Negative.   Musculoskeletal: Positive for arthralgias.       Objective:   Physical Exam Constitutional:      Appearance: Normal appearance.  Cardiovascular:     Rate and Rhythm: Normal rate and regular rhythm.     Pulses: Normal pulses.     Heart sounds: Normal heart sounds.  Pulmonary:     Effort: Pulmonary effort is normal.     Breath sounds: Normal breath sounds.  Musculoskeletal:     Comments: The left knee appears normal, no swelling. ROM is full although extremes of flexion and extension are painful. No crepitus. She is tender along the medial joint space and in the popliteal space. Negative anterior drawer.   Neurological:     Mental Status: She is alert.           Assessment & Plan:  Left knee injury, likely a medial meniscal tear. She will take Aleve 2 tabs BID for 2 weeks and wear an elastic support sleeve for 2 weeks. If no better at that time she will follow up. Gershon Crane, MD

## 2019-09-18 ENCOUNTER — Other Ambulatory Visit: Payer: Self-pay | Admitting: Family Medicine

## 2019-09-24 DIAGNOSIS — M62512 Muscle wasting and atrophy, not elsewhere classified, left shoulder: Secondary | ICD-10-CM | POA: Diagnosis not present

## 2019-10-14 ENCOUNTER — Encounter: Payer: Self-pay | Admitting: Family Medicine

## 2019-10-14 DIAGNOSIS — Z1231 Encounter for screening mammogram for malignant neoplasm of breast: Secondary | ICD-10-CM

## 2019-10-14 DIAGNOSIS — Z803 Family history of malignant neoplasm of breast: Secondary | ICD-10-CM

## 2019-10-15 NOTE — Telephone Encounter (Signed)
Please advise 

## 2019-10-16 NOTE — Telephone Encounter (Signed)
Please advise 

## 2019-10-18 ENCOUNTER — Telehealth (INDEPENDENT_AMBULATORY_CARE_PROVIDER_SITE_OTHER): Payer: Medicare Other | Admitting: Family Medicine

## 2019-10-18 DIAGNOSIS — N390 Urinary tract infection, site not specified: Secondary | ICD-10-CM | POA: Diagnosis not present

## 2019-10-18 DIAGNOSIS — R300 Dysuria: Secondary | ICD-10-CM

## 2019-10-18 LAB — POCT URINALYSIS DIPSTICK
Bilirubin, UA: NEGATIVE
Blood, UA: NEGATIVE
Glucose, UA: NEGATIVE
Ketones, UA: NEGATIVE
Leukocytes, UA: NEGATIVE
Nitrite, UA: NEGATIVE
Protein, UA: NEGATIVE
Spec Grav, UA: 1.01 (ref 1.010–1.025)
Urobilinogen, UA: 0.2 E.U./dL
pH, UA: 7.5 (ref 5.0–8.0)

## 2019-10-18 MED ORDER — CEFDINIR 300 MG PO CAPS: 300.0000 mg | ORAL_CAPSULE | Freq: Two times a day (BID) | ORAL | 0 refills | Status: DC

## 2019-10-18 NOTE — Progress Notes (Signed)
Virtual Visit via Video Note  I connected with Rebecca Davidson  on 10/18/19 at 11:30 AM EDT by a video enabled telemedicine application and verified that I am speaking with the correct person using two identifiers.  Location patient: home Location provider:work or home office Persons participating in the virtual visit: patient, provider  I discussed the limitations of evaluation and management by telemedicine and the availability of in person appointments. The patient expressed understanding and agreed to proceed.   Rebecca Davidson DOB: [birth year] Encounter date: 10/18/2019  This is a 74 y.o. female who presents with Chief Complaint  Patient presents with  . Urinary Frequency    dysuria, back pain, fatigue x 1day    History of present illness: Has pressure, low back pain, tired. All the symptoms she usually has with UTI. No fevers. Noticed yesterday just very tired. Urinating every 15-20 minutes - emptying a lot. Drinking a lot of water. Started yesterday. Up through night to go to the bathroom.   Low suprapubic discomfort. No blood in urine. Much yellow-er and there is odor. No vaginal discharge.    Allergies  Allergen Reactions  . Ciprofloxacin     Made her sick   . Macrobid [Nitrofurantoin]     Makes her sick  . Myrbetriq [Mirabegron] Nausea Only  . Doxycycline Rash  . Sulfa Antibiotics Swelling and Rash   Current Meds  Medication Sig  . cholecalciferol (VITAMIN D3) 25 MCG (1000 UT) tablet Take 1,000 Units by mouth daily.  . Magnesium 250 MG TABS Take 250 mg by mouth daily.  . Multiple Vitamins-Minerals (CENTRUM ADULTS PO) Take 1 tablet by mouth daily.  . propranolol ER (INDERAL LA) 60 MG 24 hr capsule TAKE 1 CAPSULE BY MOUTH EVERY DAY  . SUMAtriptan (IMITREX) 100 MG tablet TAKE 1 TABLET BY MOUTH ONE AS NEEDED FOR UP TO 1 DOSE FOR MIGRAINE HEADACHE, MAY TAKE ADDITONAL DOSE FOR RELIEFE    Review of Systems  Constitutional: Negative for chills, fatigue and fever.  Respiratory:  Negative for cough, chest tightness, shortness of breath and wheezing.   Cardiovascular: Negative for chest pain, palpitations and leg swelling.  Genitourinary: Positive for dysuria and frequency. Negative for decreased urine volume, difficulty urinating, flank pain and hematuria.    Objective:  There were no vitals taken for this visit.      BP Readings from Last 3 Encounters:  08/30/19 122/68  07/09/19 125/79  04/02/19 131/84   Wt Readings from Last 3 Encounters:  08/30/19 138 lb 3.2 oz (62.7 kg)  07/09/19 131 lb (59.4 kg)  04/02/19 131 lb (59.4 kg)    EXAM:  GENERAL: alert, oriented, appears well and in no acute distress  HEENT: atraumatic, conjunctiva clear, no obvious abnormalities on inspection of external nose and ears  NECK: normal movements of the head and neck  LUNGS: on inspection no signs of respiratory distress, breathing rate appears normal, no obvious gross SOB, gasping or wheezing  CV: no obvious cyanosis  MS: moves all visible extremities without noticeable abnormality  PSYCH/NEURO: pleasant and cooperative, no obvious depression or anxiety, speech and thought processing grossly intact   Assessment/Plan  1. Dysuria Symptoms sound convincing for UTI, even though urinalysis appears negative.  We are going to go ahead and cover her with antibiotic while we await urine culture results.  She has had difficulty with antibiotic tolerance in the past, so we are going to try Omnicef since the course only requires 3 days of treatment. - POCT urinalysis dipstick -  CULTURE, URINE COMPREHENSIVE  2. Urinary tract infection without hematuria, site unspecified See above - CULTURE, URINE COMPREHENSIVE  Return for pending urine culture.     I discussed the assessment and treatment plan with the patient. The patient was provided an opportunity to ask questions and all were answered. The patient agreed with the plan and demonstrated an understanding of the  instructions.   The patient was advised to call back or seek an in-person evaluation if the symptoms worsen or if the condition fails to improve as anticipated.  I provided 20 minutes of non-face-to-face time during this encounter.   Micheline Rough, MD

## 2019-10-20 LAB — CULTURE, URINE COMPREHENSIVE
MICRO NUMBER:: 10608240
RESULT:: NO GROWTH
SPECIMEN QUALITY:: ADEQUATE

## 2019-10-21 NOTE — Progress Notes (Signed)
Noted.  I did respond to the my chart message already.

## 2019-10-25 DIAGNOSIS — M62512 Muscle wasting and atrophy, not elsewhere classified, left shoulder: Secondary | ICD-10-CM | POA: Diagnosis not present

## 2019-10-29 ENCOUNTER — Other Ambulatory Visit: Payer: Self-pay

## 2019-10-29 ENCOUNTER — Ambulatory Visit
Admission: RE | Admit: 2019-10-29 | Discharge: 2019-10-29 | Disposition: A | Discharge status: Home / Self Care | Payer: Medicare Other | Source: Ambulatory Visit | Attending: Family Medicine | Admitting: Family Medicine

## 2019-10-29 DIAGNOSIS — R6889 Other general symptoms and signs: Secondary | ICD-10-CM | POA: Diagnosis not present

## 2019-10-29 DIAGNOSIS — Z803 Family history of malignant neoplasm of breast: Secondary | ICD-10-CM

## 2019-10-29 DIAGNOSIS — Z1231 Encounter for screening mammogram for malignant neoplasm of breast: Secondary | ICD-10-CM

## 2019-11-12 ENCOUNTER — Ambulatory Visit: Payer: Medicare Other | Admitting: Orthopaedic Surgery

## 2019-11-12 ENCOUNTER — Encounter: Payer: Self-pay | Admitting: Orthopaedic Surgery

## 2019-11-12 VITALS — BP 142/85 | HR 66

## 2019-11-12 DIAGNOSIS — S43005D Unspecified dislocation of left shoulder joint, subsequent encounter: Secondary | ICD-10-CM | POA: Diagnosis not present

## 2019-11-12 DIAGNOSIS — R6889 Other general symptoms and signs: Secondary | ICD-10-CM | POA: Diagnosis not present

## 2019-11-12 DIAGNOSIS — S4432XS Injury of axillary nerve, left arm, sequela: Secondary | ICD-10-CM

## 2019-11-12 NOTE — Progress Notes (Signed)
Office Visit Note   Patient: Rebecca Davidson           Date of Birth: 1945/12/03           MRN: [id number] Visit Date: 11/12/2019              Requested by: Wynn Banker, MD [address] [place],  [place] [address] PCP: Wynn Banker, MD   Assessment & Plan: Visit Diagnoses:  1. Dislocation of left shoulder joint, subsequent encounter   2. Injury of left axillary nerve, sequela     Plan: Patient is gotten gradually return to the deltoid she still has atrophy but does have some deltoid function.  With greater tuberosity displaced and posteriorly located she is limited in abduction of flexion only 80 degrees but has ability to wash her hair, dress and has modified activities of daily living so she is functioning well.  She is not interested in shoulder surgery at this point which we have discussed.  Follow-Up Instructions: No follow-ups on file.   Orders:  No orders of the defined types were placed in this encounter.  No orders of the defined types were placed in this encounter.     Procedures: No procedures performed   Clinical Data: No additional findings.   Subjective: Chief Complaint  Patient presents with  . Left Shoulder - Follow-up    HPI 11 months post left shoulder dislocation nondominant arm with axillary nerve injury and greater tuberosity fracture.  Her shoulder is reduced the nerve pain is gradually improved she still has some nerve pain but nothing like what she had neatly postop she has some deltoid atrophy but still has some deltoid return.  She can flex to 80 degrees abduct to 80 degrees is limited external rotation to 45 degrees.  She is able to bathe dress and do normal household activities much easier.  Review of Systems updated unchanged from 07/09/2019 office visit other than as mentioned above.   Objective: Vital Signs: BP (!) 142/85 (BP Location: Right Arm, Patient Position: Sitting)   Pulse 66   Physical Exam Constitutional:       Appearance: She is well-developed.  HENT:     Head: Normocephalic.     Right Ear: External ear normal.     Left Ear: External ear normal.  Eyes:     Pupils: Pupils are equal, round, and reactive to light.  Neck:     Thyroid: No thyromegaly.     Trachea: No tracheal deviation.  Cardiovascular:     Rate and Rhythm: Normal rate.  Pulmonary:     Effort: Pulmonary effort is normal.  Abdominal:     Palpations: Abdomen is soft.  Skin:    General: Skin is warm and dry.  Neurological:     Mental Status: She is alert and oriented to person, place, and time.  Psychiatric:        Behavior: Behavior normal.     Ortho Exam patient has deltoid atrophy but has some deltoid contraction now rated at 4- out of 5.  Some decreased lateral shoulder sensation but improved from immediate post dislocation where she had none.  Sensation median radial ulnar to the hand is normal. Specialty Comments:  No specialty comments available.  Imaging: No results found.   PMFS History: Patient Active Problem List   Diagnosis Date Noted  . Multiple thyroid nodules 04/14/2019  . Nephrolithiasis 03/27/2019  . Vitamin D deficiency 03/27/2019  . Osteopenia 03/27/2019  . 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 Shoulder dislocation 12/25/2018   Past Medical History:  Diagnosis Date  . Nephrolithiasis     Family History  Problem Relation Age of Onset  . Alzheimer's disease Mother   . Diabetes Mother   . Aneurysm Father   . Hypertension Sister   . Hyperlipidemia Sister   . Breast cancer Sister     No past surgical history on file. Social History   Occupational History  . Not on file  Tobacco Use  . Smoking status: Never Smoker  . Smokeless tobacco: Never Used  Substance and Sexual Activity  . Alcohol use: Not Currently  . Drug use: Not on file  . Sexual activity: Not on file

## 2019-11-24 DIAGNOSIS — M62512 Muscle wasting and atrophy, not elsewhere classified, left shoulder: Secondary | ICD-10-CM | POA: Diagnosis not present

## 2019-12-10 ENCOUNTER — Other Ambulatory Visit: Payer: Self-pay | Admitting: Family Medicine

## 2019-12-25 DIAGNOSIS — M62512 Muscle wasting and atrophy, not elsewhere classified, left shoulder: Secondary | ICD-10-CM | POA: Diagnosis not present

## 2020-01-07 ENCOUNTER — Encounter: Payer: Self-pay | Admitting: Family Medicine

## 2020-01-07 DIAGNOSIS — H35342 Macular cyst, hole, or pseudohole, left eye: Secondary | ICD-10-CM | POA: Diagnosis not present

## 2020-01-07 DIAGNOSIS — R6889 Other general symptoms and signs: Secondary | ICD-10-CM | POA: Diagnosis not present

## 2020-01-07 DIAGNOSIS — H04123 Dry eye syndrome of bilateral lacrimal glands: Secondary | ICD-10-CM | POA: Diagnosis not present

## 2020-01-07 DIAGNOSIS — H0102A Squamous blepharitis right eye, upper and lower eyelids: Secondary | ICD-10-CM | POA: Diagnosis not present

## 2020-01-07 DIAGNOSIS — Z961 Presence of intraocular lens: Secondary | ICD-10-CM | POA: Diagnosis not present

## 2020-03-16 ENCOUNTER — Other Ambulatory Visit: Payer: Self-pay | Admitting: Family Medicine

## 2020-05-15 ENCOUNTER — Encounter: Payer: Medicare Other | Admitting: Family Medicine

## 2020-06-08 ENCOUNTER — Other Ambulatory Visit: Payer: Self-pay | Admitting: Family Medicine

## 2020-06-10 ENCOUNTER — Other Ambulatory Visit: Payer: Self-pay | Admitting: *Deleted

## 2020-06-10 MED ORDER — PROPRANOLOL HCL ER 60 MG PO CP24: ORAL_CAPSULE | ORAL | 0 refills | Status: DC

## 2020-06-25 ENCOUNTER — Other Ambulatory Visit: Payer: Self-pay

## 2020-06-26 ENCOUNTER — Encounter: Payer: Self-pay | Admitting: Family Medicine

## 2020-06-26 ENCOUNTER — Ambulatory Visit (INDEPENDENT_AMBULATORY_CARE_PROVIDER_SITE_OTHER): Payer: Medicare Other | Admitting: Family Medicine

## 2020-06-26 VITALS — BP 138/84 | HR 72 | Temp 97.7°F | Ht 62.0 in | Wt 136.2 lb

## 2020-06-26 DIAGNOSIS — E538 Deficiency of other specified B group vitamins: Secondary | ICD-10-CM

## 2020-06-26 DIAGNOSIS — Z131 Encounter for screening for diabetes mellitus: Secondary | ICD-10-CM

## 2020-06-26 DIAGNOSIS — L659 Nonscarring hair loss, unspecified: Secondary | ICD-10-CM

## 2020-06-26 DIAGNOSIS — E559 Vitamin D deficiency, unspecified: Secondary | ICD-10-CM | POA: Diagnosis not present

## 2020-06-26 DIAGNOSIS — K649 Unspecified hemorrhoids: Secondary | ICD-10-CM

## 2020-06-26 DIAGNOSIS — Z0000 Encounter for general adult medical examination without abnormal findings: Secondary | ICD-10-CM | POA: Diagnosis not present

## 2020-06-26 DIAGNOSIS — Z13220 Encounter for screening for lipoid disorders: Secondary | ICD-10-CM

## 2020-06-26 DIAGNOSIS — M25512 Pain in left shoulder: Secondary | ICD-10-CM | POA: Diagnosis not present

## 2020-06-26 LAB — COMPREHENSIVE METABOLIC PANEL
ALT: 13 U/L (ref 0–35)
AST: 20 U/L (ref 0–37)
Albumin: 4.5 g/dL (ref 3.5–5.2)
Alkaline Phosphatase: 87 U/L (ref 39–117)
BUN: 13 mg/dL (ref 6–23)
CO2: 30 mEq/L (ref 19–32)
Calcium: 10.2 mg/dL (ref 8.4–10.5)
Chloride: 102 mEq/L (ref 96–112)
Creatinine, Ser: 0.66 mg/dL (ref 0.40–1.20)
GFR: 85.79 mL/min (ref >60.00)
Glucose, Bld: 100 mg/dL — ABNORMAL HIGH (ref 70–99)
Potassium: 4.4 mEq/L (ref 3.5–5.1)
Sodium: 139 mEq/L (ref 135–145)
Total Bilirubin: 0.9 mg/dL (ref 0.2–1.2)
Total Protein: 7.4 g/dL (ref 6.0–8.3)

## 2020-06-26 LAB — CBC WITH DIFFERENTIAL/PLATELET
Basophils Absolute: 0.1 10*3/uL (ref 0.0–0.1)
Basophils Relative: 1 % (ref 0.0–3.0)
Eosinophils Absolute: 0.2 10*3/uL (ref 0.0–0.7)
Eosinophils Relative: 2.2 % (ref 0.0–5.0)
HCT: 40.9 % (ref 36.0–46.0)
Hemoglobin: 14.1 g/dL (ref 12.0–15.0)
Lymphocytes Relative: 42.2 % (ref 12.0–46.0)
Lymphs Abs: 3.3 10*3/uL (ref 0.7–4.0)
MCHC: 34.5 g/dL (ref 30.0–36.0)
MCV: 89.4 fl (ref 78.0–100.0)
Monocytes Absolute: 0.5 10*3/uL (ref 0.1–1.0)
Monocytes Relative: 6.3 % (ref 3.0–12.0)
Neutro Abs: 3.8 10*3/uL (ref 1.4–7.7)
Neutrophils Relative %: 48.3 % (ref 43.0–77.0)
Platelets: 327 10*3/uL (ref 150.0–400.0)
RBC: 4.58 Mil/uL (ref 3.87–5.11)
RDW: 12.2 % (ref 11.5–15.5)
WBC: 7.8 10*3/uL (ref 4.0–10.5)

## 2020-06-26 LAB — VITAMIN B12: Vitamin B-12: 556 pg/mL (ref 211–911)

## 2020-06-26 LAB — LIPID PANEL
Cholesterol: 249 mg/dL — ABNORMAL HIGH (ref 0–200)
HDL: 86.3 mg/dL (ref >39.00)
LDL Cholesterol: 144 mg/dL — ABNORMAL HIGH (ref 0–99)
NonHDL: 163.05
Total CHOL/HDL Ratio: 3
Triglycerides: 97 mg/dL (ref 0.0–149.0)
VLDL: 19.4 mg/dL (ref 0.0–40.0)

## 2020-06-26 LAB — VITAMIN D 25 HYDROXY (VIT D DEFICIENCY, FRACTURES): VITD: 61.73 ng/mL (ref 30.00–100.00)

## 2020-06-26 LAB — IBC + FERRITIN
Ferritin: 169.7 ng/mL (ref 10.0–291.0)
Iron: 95 ug/dL (ref 42–145)
Saturation Ratios: 28.5 % (ref 20.0–50.0)
Transferrin: 238 mg/dL (ref 212.0–360.0)

## 2020-06-26 LAB — TSH: TSH: 0.67 u[IU]/mL (ref 0.35–4.50)

## 2020-06-26 LAB — HEMOGLOBIN A1C: Hgb A1c MFr Bld: 5.6 % (ref 4.6–6.5)

## 2020-06-26 MED ORDER — MELOXICAM 7.5 MG PO TABS: 7.5000 mg | ORAL_TABLET | Freq: Every day | ORAL | 0 refills | Status: DC

## 2020-06-26 MED ORDER — TRIAMCINOLONE ACETONIDE 0.5 % EX OINT: 1.0000 "application " | TOPICAL_OINTMENT | Freq: Two times a day (BID) | CUTANEOUS | 0 refills | Status: DC

## 2020-06-26 NOTE — Progress Notes (Signed)
Rebecca Davidson DOB: 08/10/1945 Encounter date: 06/26/2020  This is a 75 y.o. female who presents for complete physical   History of present illness/Additional concerns: Since falling a year and a half ago - started losing hair; has had some balder patches starting - back of head. Coming out at roots. Hasn't done or tried anything for it. She started decreasing hair washing - and is letting hair go grey.   Not sleeping as well - every time she turns it hurts her arm. She is at a better place than she felt she would be without PT. Very happy with [facility]. She is not taking any medication for pain. She was tried on gabapentin but it made her feel miserable. She did get tens unit for awhile which helped. No swelling in the shoulder but does feel like left side is a little bigger. Happy with ROM of extension to 90 deg.   Has external hemorrhoid; comes and goes.   Past Medical History:  Diagnosis Date  . Nephrolithiasis    History reviewed. No pertinent surgical history. Allergies  Allergen Reactions  . Ciprofloxacin     Made her sick   . Macrobid [Nitrofurantoin]     Makes her sick  . Myrbetriq [Mirabegron] Nausea Only  . Doxycycline Rash  . Sulfa Antibiotics Swelling and Rash   Current Meds  Medication Sig  . cholecalciferol (VITAMIN D3) 25 MCG (1000 UT) tablet Take 1,000 Units by mouth daily.  . Magnesium 250 MG TABS Take 250 mg by mouth daily.  . meloxicam (MOBIC) 7.5 MG tablet Take 1 tablet (7.5 mg total) by mouth daily.  . Multiple Vitamins-Minerals (CENTRUM ADULTS PO) Take 1 tablet by mouth daily.  . propranolol ER (INDERAL LA) 60 MG 24 hr capsule TAKE 1 CAPSULE BY MOUTH EVERY DAY  . SUMAtriptan (IMITREX) 100 MG tablet TAKE 1 TABLET BY MOUTH ONE AS NEEDED FOR UP TO 1 DOSE FOR MIGRAINE HEADACHE, MAY TAKE ADDITONAL DOSE FOR RELIEFE  . triamcinolone ointment (KENALOG) 0.5 % Apply 1 application topically 2 (two) times daily.   Social History   Tobacco Use  . Smoking status: Never  Smoker  . Smokeless tobacco: Never Used  Substance Use Topics  . Alcohol use: Not Currently   Family History  Problem Relation Age of Onset  . Alzheimer's disease Mother   . Diabetes Mother   . Aneurysm Father   . Hypertension Sister   . Hyperlipidemia Sister   . Breast cancer Sister      Review of Systems  Constitutional: Negative for activity change, appetite change, chills, fatigue, fever and unexpected weight change.  HENT: Negative for congestion, ear pain, hearing loss, sinus pressure, sinus pain, sore throat and trouble swallowing.   Eyes: Negative for pain and visual disturbance.  Respiratory: Negative for cough, chest tightness, shortness of breath and wheezing.   Cardiovascular: Negative for chest pain, palpitations and leg swelling.  Gastrointestinal: Negative for abdominal pain, blood in stool, constipation, diarrhea, nausea and vomiting.  Genitourinary: Negative for difficulty urinating and menstrual problem.  Musculoskeletal: Negative for arthralgias and back pain.  Skin: Negative for rash.       There are some areas with loss of hair follicles; posterior head. No hair breakage. Hair does appear thinner in back of head.  Neurological: Negative for dizziness, weakness, numbness and headaches.  Hematological: Negative for adenopathy. Does not bruise/bleed easily.  Psychiatric/Behavioral: Negative for sleep disturbance and suicidal ideas. The patient is not nervous/anxious.     CBC:  Lab  Results  Component Value Date   WBC 7.8 06/26/2020   HGB 14.1 06/26/2020   HCT 40.9 06/26/2020   MCHC 34.5 06/26/2020   RDW 12.2 06/26/2020   PLT 327.0 06/26/2020   CMP: Lab Results  Component Value Date   NA 139 06/26/2020   K 4.4 06/26/2020   CL 102 06/26/2020   CO2 30 06/26/2020   GLUCOSE 100 (H) 06/26/2020   BUN 13 06/26/2020   CREATININE 0.66 06/26/2020   CALCIUM 10.2 06/26/2020   PROT 7.4 06/26/2020   BILITOT 0.9 06/26/2020   ALKPHOS 87 06/26/2020   ALT 13  06/26/2020   AST 20 06/26/2020   LIPID: Lab Results  Component Value Date   CHOL 249 (H) 06/26/2020   TRIG 97.0 06/26/2020   HDL 86.30 06/26/2020   LDLCALC 144 (H) 06/26/2020    Objective:  BP 138/84 (BP Location: Right Arm, Patient Position: Sitting, Cuff Size: Normal)   Pulse 72   Temp 97.7 F (36.5 C) (Oral)   Ht 5\' 2"  (1.575 m)   Wt 136 lb 3.2 oz (61.8 kg)   SpO2 96%   BMI 24.91 kg/m   Weight: 136 lb 3.2 oz (61.8 kg)   BP Readings from Last 3 Encounters:  06/26/20 138/84  11/12/19 (!) 142/85  08/30/19 122/68   Wt Readings from Last 3 Encounters:  06/26/20 136 lb 3.2 oz (61.8 kg)  08/30/19 138 lb 3.2 oz (62.7 kg)  07/09/19 131 lb (59.4 kg)    Physical Exam Constitutional:      General: She is not in acute distress.    Appearance: She is well-developed and well-nourished.  HENT:     Head: Normocephalic and atraumatic.     Right Ear: External ear normal.     Left Ear: External ear normal.     Mouth/Throat:     Mouth: Oropharynx is clear and moist.     Pharynx: No oropharyngeal exudate.  Eyes:     Conjunctiva/sclera: Conjunctivae normal.     Pupils: Pupils are equal, round, and reactive to light.  Neck:     Thyroid: No thyromegaly.  Cardiovascular:     Rate and Rhythm: Normal rate and regular rhythm.     Heart sounds: Normal heart sounds. No murmur heard. No friction rub. No gallop.   Pulmonary:     Effort: Pulmonary effort is normal.     Breath sounds: Normal breath sounds.  Abdominal:     General: Bowel sounds are normal. There is no distension.     Palpations: Abdomen is soft. There is no mass.     Tenderness: There is no abdominal tenderness. There is no guarding.     Hernia: No hernia is present.  Musculoskeletal:        General: No tenderness, deformity or edema. Normal range of motion.     Cervical back: Normal range of motion and neck supple.     Comments: Limited to abduction at about 80 degrees left shoulder.  She has pain with this  movement.  She is tender over trapezius, deltoid, pectoral.  Posterior shoulder tenderness.  Lymphadenopathy:     Cervical: No cervical adenopathy.  Skin:    General: Skin is warm and dry.     Findings: No rash.  Neurological:     Mental Status: She is alert and oriented to person, place, and time.     Deep Tendon Reflexes: Strength normal. Reflexes normal.     Reflex Scores:      Tricep reflexes  are 2+ on the right side and 2+ on the left side.      Bicep reflexes are 2+ on the right side and 2+ on the left side.      Brachioradialis reflexes are 2+ on the right side and 2+ on the left side.      Patellar reflexes are 2+ on the right side and 2+ on the left side. Psychiatric:        Mood and Affect: Mood and affect normal.        Speech: Speech normal.        Behavior: Behavior normal.        Thought Content: Thought content normal.     Assessment/Plan: Health Maintenance Due  Topic Date Due  . TETANUS/TDAP  Never done  . PNA vac Low Risk Adult (2 of 2 - PPSV23) 05/02/2016   Health Maintenance reviewed.  1. Preventative health care  Keep up with active lifestyle.  Keep up with healthy eating.   2.Left shoulder pain, unspecified chronicity She is interested in doing anything that will make her shoulder better.  We are to start with a short trial of meloxicam.  She is not taking anti-inflammatories for this entire course since breaking her arm.  She will let me know how this does and if it helps at all.  I do feel she may benefit from some other therapy modalities to work on supportive muscles (like cupping). - meloxicam (MOBIC) 7.5 MG tablet; Take 1 tablet (7.5 mg total) by mouth daily.  Dispense: 30 tablet; Refill: 0   3. Hair loss Start with blood work and consider further treatment or potentially dermatology referral pending results. - CBC with Differential/Platelet; Future - Comprehensive metabolic panel; Future - TSH; Future - IBC + Ferritin; Future - IBC +  Ferritin - CBC with Differential/Platelet - Comprehensive metabolic panel - TSH  4. Vitamin D deficiency - VITAMIN D 25 Hydroxy (Vit-D Deficiency, Fractures); Future - VITAMIN D 25 Hydroxy (Vit-D Deficiency, Fractures)  5. B12 deficiency - Vitamin B12; Future - Vitamin B12  6. Lipid screening - Lipid panel; Future - Lipid panel  7. Screening for diabetes mellitus - Hemoglobin A1c; Future - Hemoglobin A1c  8. Hemorrhoids, unspecified hemorrhoid type - triamcinolone ointment (KENALOG) 0.5 %; Apply 1 application topically 2 (two) times daily.  Dispense: 30 g; Refill: 0  Return for pending labs, shoulder pain.  Theodis Shove, MD

## 2020-07-06 ENCOUNTER — Telehealth: Payer: Self-pay | Admitting: Family Medicine

## 2020-07-06 ENCOUNTER — Encounter: Payer: Self-pay | Admitting: Family Medicine

## 2020-07-06 NOTE — Telephone Encounter (Signed)
Pt is calling in stating that she called before the office opened wanting to know if Dr. Hassan Rowan had any suggestion for bleeding hemorrhoids that started on last Thursday 07/02/2020 and she had started a cream (triamcinolone ointment (KENALOG) 0.5% that Dr. Hassan Rowan had given to her 06/26/2020 during her CPE.  Pt is wanting to now what to do if she should continue the ointment since it is bleeding.  Pt would like to have a call back.

## 2020-07-08 ENCOUNTER — Other Ambulatory Visit: Payer: Self-pay | Admitting: Family Medicine

## 2020-07-08 DIAGNOSIS — K649 Unspecified hemorrhoids: Secondary | ICD-10-CM

## 2020-07-08 NOTE — Telephone Encounter (Signed)
Please check back in with her and see how these are doing now? She can continue use of hemorrhoid cream, but I would hold the kenalog for a few days. I would recommend sitz baths to help improve circulation/promote healing. Make sure stools are not constipated as this will aggravate hemorrhoids. If continuous bleeding (not just blood with wiping); will need to see GI.

## 2020-07-08 NOTE — Telephone Encounter (Signed)
Patient informed of the message below and still complains of bleeding, however states this is not has heavy at this time.  Patient stated she has tried sitz baths, witch hazel wipes, denies constipation and has bleeding whether wiping or not.  Patient is aware a referral will be placed to GI by PCP and someone will call with appt info.  Message sent to PCP.

## 2020-08-11 ENCOUNTER — Encounter: Payer: Self-pay | Admitting: Family Medicine

## 2020-08-24 ENCOUNTER — Other Ambulatory Visit: Payer: Self-pay

## 2020-08-24 ENCOUNTER — Encounter: Payer: Self-pay | Admitting: Family Medicine

## 2020-08-24 ENCOUNTER — Ambulatory Visit (INDEPENDENT_AMBULATORY_CARE_PROVIDER_SITE_OTHER): Payer: Medicare Other | Admitting: Family Medicine

## 2020-08-24 VITALS — BP 140/80 | HR 78 | Temp 97.9°F | Ht 62.0 in | Wt 137.1 lb

## 2020-08-24 DIAGNOSIS — Z7189 Other specified counseling: Secondary | ICD-10-CM

## 2020-08-24 DIAGNOSIS — L659 Nonscarring hair loss, unspecified: Secondary | ICD-10-CM

## 2020-08-24 NOTE — Progress Notes (Signed)
Rebecca Davidson DOB: [birth year] Encounter date: 08/24/2020  This is a 75 y.o. female who presents with Chief Complaint  Patient presents with  . patient presents to discuss DNR form    History of present illness: Had living will in [place]; but found out it is different here.   She is aware of her wants. She mostly does not want to have prolonged intervention - ie mechanical ventilation, feeding tube.  Initially she thought she wanted to be a DO NOT RESUSCITATE, but when we discussed the DNR form in more detail, she would like to have resuscitation at this point if it would mean that she could continue with a good quality of life.  We discussed each step of the MOST form and completed this today.  She does not wish to have CPR.  We reviewed what all this entails for her.  She understands that she can change her mind in the future and we discussed that as she ages, chest compressions would become more traumatic for her to recover from.  She does not wish to have IV fluids, antibiotic treatment if used for temporary improvement of her situation.  She does not wish to be on long-term fluids/feeding tubes, or mechanical ventilation.  She would consider mechanical ventilation in the event that it was used in the short-term to recover from an illness (i.e. COVID), but would not want to be on a machine for any prolonged periods of time and would not want life-sustaining measures in the event that quality of life and independence would suffer.  She has 4 children and has made them aware of her wishes.  We did make copies of this form for all of them to have.  We discussed the importance of talking through all of these situations so that they are on the same page.  She will bring me a copy of her living well once this is completed.  Allergies  Allergen Reactions  . Ciprofloxacin     Made her sick   . Macrobid [Nitrofurantoin]     Makes her sick  . Myrbetriq [Mirabegron] Nausea Only  . Doxycycline Rash  .  Sulfa Antibiotics Swelling and Rash   Current Meds  Medication Sig  . cholecalciferol (VITAMIN D3) 25 MCG (1000 UT) tablet Take 1,000 Units by mouth daily.  . Magnesium 250 MG TABS Take 250 mg by mouth daily.  . Multiple Vitamins-Minerals (CENTRUM ADULTS PO) Take 1 tablet by mouth daily.  . propranolol ER (INDERAL LA) 60 MG 24 hr capsule TAKE 1 CAPSULE BY MOUTH EVERY DAY  . SUMAtriptan (IMITREX) 100 MG tablet TAKE 1 TABLET BY MOUTH ONE AS NEEDED FOR UP TO 1 DOSE FOR MIGRAINE HEADACHE, MAY TAKE ADDITONAL DOSE FOR RELIEFE    Review of Systems  Constitutional: Negative for chills, fatigue and fever.  Respiratory: Negative for cough, chest tightness, shortness of breath and wheezing.   Cardiovascular: Negative for chest pain, palpitations and leg swelling.  Skin:       Still with concerns for hair loss.  She was happy that lab work was normal, but frustrated that there is no cause for her hair loss.  She does not wish to use Rogaine as she worries about stopping use of this as well as other side effects (like unwanted hair growth).    Objective:  BP 140/80 (BP Location: Right Arm, Patient Position: Sitting, Cuff Size: Normal)   Pulse 78   Temp 97.9 F (36.6 C) (Oral)   Ht 5\' 2"  (1.575  m)   Wt 137 lb 1.6 oz (62.2 kg)   SpO2 99%   BMI 25.08 kg/m   Weight: 137 lb 1.6 oz (62.2 kg)   BP Readings from Last 3 Encounters:  08/24/20 140/80  06/26/20 138/84  11/12/19 (!) 142/85   Wt Readings from Last 3 Encounters:  08/24/20 137 lb 1.6 oz (62.2 kg)  06/26/20 136 lb 3.2 oz (61.8 kg)  08/30/19 138 lb 3.2 oz (62.7 kg)    Physical Exam Constitutional:      General: She is not in acute distress.    Appearance: She is well-developed.  Cardiovascular:     Rate and Rhythm: Normal rate and regular rhythm.     Heart sounds: Normal heart sounds. No murmur heard. No friction rub.  Pulmonary:     Effort: Pulmonary effort is normal. No respiratory distress.     Breath sounds: Normal breath  sounds. No wheezing or rales.  Musculoskeletal:     Right lower leg: No edema.     Left lower leg: No edema.  Skin:    Comments: See pictures.  There is a light-colored papule on the scalp, but on review of the pictures patient states that this is always been there.  There is some loss of hair follicles, most noted on the top of the head.  Neurological:     Mental Status: She is alert and oriented to person, place, and time.  Psychiatric:        Behavior: Behavior normal.       Assessment/Plan   1. Hair loss - Ambulatory referral to Dermatology  2. Advanced care planning/counseling discussion See above. We reviewed and completed MOST today. DNR order is not appropriate for her as she does wish for resuscitation at this point.    Return if symptoms worsen or fail to improve.  29 minutes spent in discussion of and completion of most form with patient; review of advance care wants.    Theodis Shove, MD

## 2020-09-07 ENCOUNTER — Other Ambulatory Visit: Payer: Self-pay | Admitting: Family Medicine

## 2020-09-24 ENCOUNTER — Other Ambulatory Visit: Payer: Self-pay | Admitting: Family Medicine

## 2020-09-24 DIAGNOSIS — Z1231 Encounter for screening mammogram for malignant neoplasm of breast: Secondary | ICD-10-CM

## 2020-11-17 ENCOUNTER — Other Ambulatory Visit: Payer: Self-pay | Admitting: Family Medicine

## 2020-11-17 DIAGNOSIS — G43809 Other migraine, not intractable, without status migrainosus: Secondary | ICD-10-CM

## 2020-11-17 MED ORDER — SUMATRIPTAN SUCCINATE 100 MG PO TABS: ORAL_TABLET | ORAL | 1 refills | Status: DC

## 2020-11-17 NOTE — Telephone Encounter (Signed)
Rx done. 

## 2020-11-17 NOTE — Addendum Note (Signed)
Addended by: Johnella Moloney on: 11/17/2020 02:58 PM   Modules accepted: Orders

## 2020-11-20 ENCOUNTER — Ambulatory Visit: Payer: Medicare Other

## 2020-12-25 ENCOUNTER — Ambulatory Visit: Payer: Medicare Other

## 2021-01-05 ENCOUNTER — Other Ambulatory Visit: Payer: Self-pay

## 2021-01-05 ENCOUNTER — Ambulatory Visit
Admission: RE | Admit: 2021-01-05 | Discharge: 2021-01-05 | Disposition: A | Discharge status: Home / Self Care | Payer: Medicare Other | Source: Ambulatory Visit | Attending: Family Medicine | Admitting: Family Medicine

## 2021-01-05 DIAGNOSIS — Z1231 Encounter for screening mammogram for malignant neoplasm of breast: Secondary | ICD-10-CM

## 2021-01-05 DIAGNOSIS — R6889 Other general symptoms and signs: Secondary | ICD-10-CM | POA: Diagnosis not present

## 2021-01-07 DIAGNOSIS — H04123 Dry eye syndrome of bilateral lacrimal glands: Secondary | ICD-10-CM | POA: Diagnosis not present

## 2021-01-07 DIAGNOSIS — H35342 Macular cyst, hole, or pseudohole, left eye: Secondary | ICD-10-CM | POA: Diagnosis not present

## 2021-01-07 DIAGNOSIS — H35363 Drusen (degenerative) of macula, bilateral: Secondary | ICD-10-CM | POA: Diagnosis not present

## 2021-01-07 DIAGNOSIS — Z961 Presence of intraocular lens: Secondary | ICD-10-CM | POA: Diagnosis not present

## 2021-01-07 DIAGNOSIS — H524 Presbyopia: Secondary | ICD-10-CM | POA: Diagnosis not present

## 2021-01-31 ENCOUNTER — Encounter (HOSPITAL_BASED_OUTPATIENT_CLINIC_OR_DEPARTMENT_OTHER): Payer: Self-pay | Admitting: Urology

## 2021-01-31 ENCOUNTER — Other Ambulatory Visit: Payer: Self-pay

## 2021-01-31 ENCOUNTER — Emergency Department (HOSPITAL_BASED_OUTPATIENT_CLINIC_OR_DEPARTMENT_OTHER)
Admission: EM | Admit: 2021-01-31 | Discharge: 2021-02-01 | Disposition: A | Discharge status: Home / Self Care | Payer: Medicare Other | Attending: Emergency Medicine | Admitting: Emergency Medicine

## 2021-01-31 DIAGNOSIS — K449 Diaphragmatic hernia without obstruction or gangrene: Secondary | ICD-10-CM | POA: Diagnosis not present

## 2021-01-31 DIAGNOSIS — R109 Unspecified abdominal pain: Secondary | ICD-10-CM | POA: Diagnosis not present

## 2021-01-31 DIAGNOSIS — Z8616 Personal history of COVID-19: Secondary | ICD-10-CM | POA: Diagnosis not present

## 2021-01-31 DIAGNOSIS — K5730 Diverticulosis of large intestine without perforation or abscess without bleeding: Secondary | ICD-10-CM | POA: Diagnosis not present

## 2021-01-31 DIAGNOSIS — I700 Atherosclerosis of aorta: Secondary | ICD-10-CM | POA: Diagnosis not present

## 2021-01-31 NOTE — ED Triage Notes (Signed)
Left flank pain that started Friday, states pain constant.  Denies any urinary symptoms associated.  H/o kidney stones

## 2021-02-01 ENCOUNTER — Encounter: Payer: Self-pay | Admitting: Family Medicine

## 2021-02-01 ENCOUNTER — Emergency Department (HOSPITAL_BASED_OUTPATIENT_CLINIC_OR_DEPARTMENT_OTHER): Payer: Medicare Other

## 2021-02-01 DIAGNOSIS — R109 Unspecified abdominal pain: Secondary | ICD-10-CM | POA: Diagnosis not present

## 2021-02-01 DIAGNOSIS — Z87442 Personal history of urinary calculi: Secondary | ICD-10-CM

## 2021-02-01 DIAGNOSIS — K5730 Diverticulosis of large intestine without perforation or abscess without bleeding: Secondary | ICD-10-CM | POA: Diagnosis not present

## 2021-02-01 DIAGNOSIS — K449 Diaphragmatic hernia without obstruction or gangrene: Secondary | ICD-10-CM | POA: Diagnosis not present

## 2021-02-01 DIAGNOSIS — I700 Atherosclerosis of aorta: Secondary | ICD-10-CM | POA: Diagnosis not present

## 2021-02-01 LAB — CBC
HCT: 39.6 % (ref 36.0–46.0)
Hemoglobin: 14 g/dL (ref 12.0–15.0)
MCH: 31.3 pg (ref 26.0–34.0)
MCHC: 35.4 g/dL (ref 30.0–36.0)
MCV: 88.4 fL (ref 80.0–100.0)
Platelets: 297 10*3/uL (ref 150–400)
RBC: 4.48 MIL/uL (ref 3.87–5.11)
RDW: 11.6 % (ref 11.5–15.5)
WBC: 7.9 10*3/uL (ref 4.0–10.5)
nRBC: 0 % (ref 0.0–0.2)

## 2021-02-01 LAB — URINALYSIS, ROUTINE W REFLEX MICROSCOPIC
Bilirubin Urine: NEGATIVE
Glucose, UA: NEGATIVE mg/dL
Hgb urine dipstick: NEGATIVE
Ketones, ur: NEGATIVE mg/dL
Leukocytes,Ua: NEGATIVE
Nitrite: NEGATIVE
Protein, ur: NEGATIVE mg/dL
Specific Gravity, Urine: 1.015 (ref 1.005–1.030)
pH: 6.5 (ref 5.0–8.0)

## 2021-02-01 LAB — BASIC METABOLIC PANEL
Anion gap: 9 (ref 5–15)
BUN: 13 mg/dL (ref 8–23)
CO2: 23 mmol/L (ref 22–32)
Calcium: 9.2 mg/dL (ref 8.9–10.3)
Chloride: 105 mmol/L (ref 98–111)
Creatinine, Ser: 0.65 mg/dL (ref 0.44–1.00)
GFR, Estimated: >60 mL/min (ref >60)
Glucose, Bld: 124 mg/dL — ABNORMAL HIGH (ref 70–99)
Potassium: 4.1 mmol/L (ref 3.5–5.1)
Sodium: 137 mmol/L (ref 135–145)

## 2021-02-01 MED ORDER — FENTANYL CITRATE PF 50 MCG/ML IJ SOSY
50.0000 ug | PREFILLED_SYRINGE | Freq: Once | INTRAMUSCULAR | Status: AC
  Administered 2021-02-01: 50 ug via INTRAVENOUS
  Filled 2021-02-01: qty 1

## 2021-02-01 MED ORDER — LIDOCAINE 5 % EX PTCH: 1.0000 | MEDICATED_PATCH | CUTANEOUS | 0 refills | Status: DC

## 2021-02-01 MED ORDER — KETOROLAC TROMETHAMINE 15 MG/ML IJ SOLN
15.0000 mg | Freq: Once | INTRAMUSCULAR | Status: AC
  Administered 2021-02-01: 15 mg via INTRAVENOUS
  Filled 2021-02-01: qty 1

## 2021-02-01 MED ORDER — METHOCARBAMOL 500 MG PO TABS: 500.0000 mg | ORAL_TABLET | Freq: Three times a day (TID) | ORAL | 0 refills | Status: DC | PRN

## 2021-02-01 MED ORDER — SODIUM CHLORIDE 0.9 % IV BOLUS
1000.0000 mL | Freq: Once | INTRAVENOUS | Status: AC
  Administered 2021-02-01: 1000 mL via INTRAVENOUS

## 2021-02-01 NOTE — Discharge Instructions (Addendum)
You were evaluated in the Emergency Department and after careful evaluation, we did not find any emergent condition requiring admission or further testing in the hospital.  Your exam/testing today was overall reassuring.  Recommend continued use of Tylenol and ibuprofen for pain.  Can use the Robaxin muscle relaxer for more significant pain.  Also recommend the Lidoderm numbing patches.  Please return to the Emergency Department if you experience any worsening of your condition.  Thank you for allowing [place] to be a part of your care.

## 2021-02-01 NOTE — ED Provider Notes (Signed)
[facility]GENCY [facility]PT [facility] Emergency Department Provider Note MRN:  [id number]  Arrival date & time: 02/01/21     Chief Complaint   Flank Pain   History of Present Illness   Rebecca Davidson is a 75 y.o. year-old female with a history of kidney stones presenting to the ED with chief complaint of flank pain.  Location: Left flank Duration: 2 or 3 days Onset: Gradual Timing: Constant pain Description: Sharp Severity: Moderate to severe Exacerbating/Alleviating Factors: Worse with certain positions Associated Symptoms: None Pertinent Negatives: No dysuria, no hematuria, no fever, no abdominal pain, no chest pain or shortness of breath  Additional History: Had her flu and COVID shots recently and was wondering if it was related to that.  Review of Systems  A complete 10 system review of systems was obtained and all systems are negative except as noted in the HPI and PMH.   Patient's Health History    Past Medical History:  Diagnosis Date   Nephrolithiasis     History reviewed. No pertinent surgical history.  Family History  Problem Relation Age of Onset   Alzheimer's disease Mother    Diabetes Mother    Aneurysm Father    Hypertension Sister    Hyperlipidemia Sister    Breast cancer Sister     Social History   Socioeconomic History   Marital status: Divorced    Spouse name: Not on file   Number of children: Not on file   Years of education: Not on file   Highest education level: Not on file  Occupational History   Not on file  Tobacco Use   Smoking status: Never   Smokeless tobacco: Never  Substance and Sexual Activity   Alcohol use: Not Currently   Drug use: Not on file   Sexual activity: Not on file  Other Topics Concern   Not on file  Social History Narrative   Not on file   Social Determinants of Health   Financial Resource Strain: Not on file  Food Insecurity: Not on file  Transportation Needs: Not on file  Physical Activity: Not on  file  Stress: Not on file  Social Connections: Not on file  Intimate Partner Violence: Not on file     Physical Exam   Vitals:   01/31/21 2343 02/01/21 0100  BP: (!) 183/88 135/78  Pulse: 80 77  Resp: 18 16  SpO2: 98% 96%    CONSTITUTIONAL: Well-appearing, NAD NEURO:  Alert and oriented x 3, no focal deficits EYES:  eyes equal and reactive ENT/NECK:  no LAD, no JVD CARDIO: Regular rate, well-perfused, normal S1 and S2 PULM:  CTAB no wheezing or rhonchi GI/GU:  normal bowel sounds, non-distended, non-tender; mild left CVA tenderness MSK/SPINE:  No gross deformities, no edema SKIN:  no rash, atraumatic PSYCH:  Appropriate speech and behavior  *Additional and/or pertinent findings included in MDM below  Diagnostic and Interventional Summary    EKG Interpretation  Date/Time:    Ventricular Rate:    PR Interval:    QRS Duration:   QT Interval:    QTC Calculation:   R Axis:     Text Interpretation:         Labs Reviewed  BASIC METABOLIC PANEL - Abnormal; Notable for the following components:      Result Value   Glucose, Bld 124 (*)    All other components within normal limits  URINALYSIS, ROUTINE W REFLEX MICROSCOPIC  CBC    CT RENAL STONE STUDY  Final Result      Medications  sodium chloride 0.9 % bolus 1,000 mL (1,000 mLs Intravenous New Bag/Given 02/01/21 0026)  fentaNYL (SUBLIMAZE) injection 50 mcg (50 mcg Intravenous Given 02/01/21 0021)  ketorolac (TORADOL) 15 MG/ML injection 15 mg (15 mg Intravenous Given 02/01/21 0023)     Procedures  /  Critical Care Procedures  ED Course and Medical Decision Making  I have reviewed the triage vital signs, the nursing notes, and pertinent available records from the EMR.  Listed above are laboratory and imaging tests that I personally ordered, reviewed, and interpreted and then considered in my medical decision making (see below for details).  Suspect kidney stone, history of 9 mm stone in the past requiring  procedural intervention.  Awaiting labs, CT.     Labs and urinalysis are normal, CT is without evidence of kidney stone or emergent process.  Numerous hepatic cysts.  Patient made aware of this finding and was advised to inform her primary care doctor about this.  Patient explains that with her prior stone the CT did not find it initially.  Will refer to urology, provide further symptomatic management at home.  Patient currently feels much better, appropriate for discharge.  Elmer Sow. Pilar Plate, MD [facility] Emergency Medicine [facility] [email or web address]  Final Clinical Impressions(s) / ED Diagnoses     ICD-10-CM   1. Flank pain  R10.9       ED Discharge Orders          Ordered    methocarbamol (ROBAXIN) 500 MG tablet  Every 8 hours PRN        02/01/21 0109    lidocaine (LIDODERM) 5 %  Every 24 hours        02/01/21 0109             Discharge Instructions Discussed with and Provided to Patient:     Discharge Instructions      You were evaluated in the Emergency Department and after careful evaluation, we did not find any emergent condition requiring admission or further testing in the hospital.  Your exam/testing today was overall reassuring.  Recommend continued use of Tylenol and ibuprofen for pain.  Can use the Robaxin muscle relaxer for more significant pain.  Also recommend the Lidoderm numbing patches.  Please return to the Emergency Department if you experience any worsening of your condition.  Thank you for allowing [place] to be a part of your care.         Sabas Sous, MD 02/01/21 0111

## 2021-02-05 ENCOUNTER — Other Ambulatory Visit: Payer: Self-pay

## 2021-02-05 ENCOUNTER — Ambulatory Visit (INDEPENDENT_AMBULATORY_CARE_PROVIDER_SITE_OTHER): Payer: Medicare Other | Admitting: Family Medicine

## 2021-02-05 ENCOUNTER — Telehealth: Payer: Self-pay | Admitting: *Deleted

## 2021-02-05 ENCOUNTER — Encounter: Payer: Self-pay | Admitting: Family Medicine

## 2021-02-05 VITALS — BP 130/80 | HR 79 | Temp 98.2°F | Ht 62.0 in | Wt 138.9 lb

## 2021-02-05 DIAGNOSIS — N200 Calculus of kidney: Secondary | ICD-10-CM

## 2021-02-05 DIAGNOSIS — M545 Low back pain, unspecified: Secondary | ICD-10-CM

## 2021-02-05 DIAGNOSIS — E559 Vitamin D deficiency, unspecified: Secondary | ICD-10-CM | POA: Diagnosis not present

## 2021-02-05 DIAGNOSIS — M8580 Other specified disorders of bone density and structure, unspecified site: Secondary | ICD-10-CM | POA: Diagnosis not present

## 2021-02-05 DIAGNOSIS — E785 Hyperlipidemia, unspecified: Secondary | ICD-10-CM

## 2021-02-05 MED ORDER — TAMSULOSIN HCL 0.4 MG PO CAPS: 0.4000 mg | ORAL_CAPSULE | Freq: Every day | ORAL | 0 refills | Status: DC

## 2021-02-05 NOTE — Telephone Encounter (Signed)
Spoke with Rebecca Davidson at [facility]iology (ph#[phone]) and informed her of the message below.  Rebecca Davidson stated she will place an addendum for the radiologist and message was forwarded to PCP.

## 2021-02-05 NOTE — Progress Notes (Signed)
Rebecca Davidson DOB: 10/29/1945 Encounter date: 02/05/2021  This is a 75 y.o. female who presents with Chief Complaint  Patient presents with   Follow-up    Patient complains of dull back pain currently    History of present illness: Was up all night Monday, Tuesday in excrutiating pain with back.   Had COVID shot, flu vaccine. Usually gets fatigued, chills, headache. But she got some achiness felt like period cramps about 6 hours later that you feel in back, but by Saturday couldn't lay down or sit. Only relief was with standing. Excrutiating Sunday when she went to the hospital. Pain was excrutiating  to make her think there was kidney stone. Halfway up buttocks to flank; stabbing pain. Usually when she has kidney stones will get pinging on side and then comes in waves. With this was just a continuing pain. Muscle relaxers didn't do much (given in ER), advil hurt stomach and didn't help.   Pain really stopped yesterday. Now just sore. Was able to lay in bed last night. Just couldn't lay down before. Has had spasms in past - just didn't feel like this.   No color change in urine; no blood in urine (although in past didn't have blood with stone). No difficulties with urination. Bowels have been normal through this - no change in pain with this.   Had some nausea - but thinks just related to pain. Only epigastric after advil.   Even pushing on back hurt.   Has a little tender up in left flank; but not much lower where it was before.   She did hear from urology - has appointment 11/14.   Taking propranolol for vestibular migraines which helped a lot.   Does worry about blood pressure.    Allergies  Allergen Reactions   Ciprofloxacin     Made her sick    Macrobid [Nitrofurantoin]     Makes her sick   Myrbetriq [Mirabegron] Nausea Only   Doxycycline Rash   Sulfa Antibiotics Swelling and Rash   Current Meds  Medication Sig   cholecalciferol (VITAMIN D3) 25 MCG (1000 UT) tablet  Take 1,000 Units by mouth daily.   Magnesium 250 MG TABS Take 250 mg by mouth daily.   methocarbamol (ROBAXIN) 500 MG tablet Take 1 tablet (500 mg total) by mouth every 8 (eight) hours as needed for muscle spasms.   Multiple Vitamins-Minerals (CENTRUM ADULTS PO) Take 1 tablet by mouth daily.   propranolol ER (INDERAL LA) 60 MG 24 hr capsule TAKE 1 CAPSULE BY MOUTH EVERY DAY   SUMAtriptan (IMITREX) 100 MG tablet TAKE 1 TABLET BY MOUTH AS NEEDED MIGRAINE HEADACHE MAY TAKE 1 ADDITIONAL   tamsulosin (FLOMAX) 0.4 MG CAPS capsule Take 1 capsule (0.4 mg total) by mouth daily.    Review of Systems  Constitutional:  Negative for chills, fatigue and fever.  Respiratory:  Negative for cough, chest tightness, shortness of breath and wheezing.   Cardiovascular:  Negative for chest pain, palpitations and leg swelling.  Gastrointestinal:  Negative for abdominal pain, blood in stool, constipation, diarrhea, nausea and vomiting.   Objective:  BP 130/80   Pulse 79   Temp 98.2 F (36.8 C) (Oral)   Ht 5\' 2"  (1.575 m)   Wt 138 lb 14.4 oz (63 kg)   SpO2 98%   BMI 25.41 kg/m   Weight: 138 lb 14.4 oz (63 kg)   BP Readings from Last 3 Encounters:  02/05/21 130/80  02/01/21 140/68  08/24/20 140/80  Wt Readings from Last 3 Encounters:  02/05/21 138 lb 14.4 oz (63 kg)  01/31/21 137 lb 2 oz (62.2 kg)  08/24/20 137 lb 1.6 oz (62.2 kg)    Physical Exam Constitutional:      General: She is not in acute distress.    Appearance: She is well-developed.  Cardiovascular:     Rate and Rhythm: Normal rate and regular rhythm.     Heart sounds: Normal heart sounds. No murmur heard.   No friction rub.  Pulmonary:     Effort: Pulmonary effort is normal. No respiratory distress.     Breath sounds: Normal breath sounds. No wheezing or rales.  Abdominal:     General: Abdomen is flat. Bowel sounds are normal.     Palpations: Abdomen is soft.     Tenderness: There is abdominal tenderness in the epigastric area.  There is no right CVA tenderness, left CVA tenderness, guarding or rebound. Negative signs include Murphy's sign and McBurney's sign.     Comments: Mild epigastric and left sided abdominal discomfort  Musculoskeletal:     Right lower leg: No edema.     Left lower leg: No edema.  Neurological:     Mental Status: She is alert and oriented to person, place, and time.  Psychiatric:        Behavior: Behavior normal.    Assessment/Plan   1. Low back pain without sciatica, unspecified back pain laterality, unspecified chronicity Pain is significantly improved from previous.  She does have a follow-up appointment set up with urology.  Her pain felt very similar to prior pain she is experienced with kidney stones.  In the past, she has had kidney stones that did not show up on routine imaging, which leaves her very concerned that she still may be dealing with a stone.  I did prescribe Flomax for her in the event that she has recurrence of discomfort, but encouraged her to reach out with any concerns.  I am also reaching out to radiology due to patient concerned that appendix was documented as normal with this have been surgically removed and there were some cysts noted in the liver.  2. Osteopenia, unspecified location She is taking 1000 units daily vitamin D.  She had improvement on bone density completed prior to moving here in 2020. May be due for repeat dexa in 2023.  3. Nephrolithiasis Has follow up appointment with urology. Hx of nephrolithiasis with similar symptoms in recent week; pain currently has subsided.   4. Vitamin D deficiency Did discuss risk of overtreatment of vitamin D given stone hx, but she is on fairly low dose for osteopenia benefit. She can also discuss with urology at upcoming appointment. Previous stones were calcium.   Return if symptoms worsen or fail to improve.     Theodis Shove, MD

## 2021-02-05 NOTE — Patient Instructions (Signed)
If your pain returns; I do recommend that you return to ER so that they can give you medication to keep you comfortable. If you start to have slight discomfort, then recommend starting flomax.

## 2021-02-05 NOTE — Telephone Encounter (Signed)
-----   Message from Wynn Banker, MD sent at 02/05/2021 11:12 AM EDT ----- Can you check with radiology from her recent CT: they commented "normal appendix" but she had appendix removed. Just want to make sure there is nothing there that should not be. Also wanted radiology opinion of they can provider on hepatic cysts whether they were able to see everything clearly or would recommend additional imaging for followup.

## 2021-02-08 NOTE — Telephone Encounter (Signed)
Rebecca Davidson called from [facility] stating the addendum was completed and is ready for review.  Message sent to PCP.

## 2021-02-09 ENCOUNTER — Other Ambulatory Visit: Payer: Self-pay | Admitting: Family Medicine

## 2021-02-09 DIAGNOSIS — K769 Liver disease, unspecified: Secondary | ICD-10-CM

## 2021-02-09 NOTE — Telephone Encounter (Signed)
Patient informed of the message below.

## 2021-02-09 NOTE — Telephone Encounter (Signed)
Thank you. 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 I do not feel that the follow up imaging for the cysts in the liver is urgent. I will order the MRI for follow up, but I think that her seeing urology first and seeing if they want to perform any additional imaging is reasonable. They may choose a study that could better show areas they are concerned about. Hopefully she has continued to improve - pain wise. Let me know if any concerns.

## 2021-02-27 ENCOUNTER — Other Ambulatory Visit: Payer: Self-pay | Admitting: Family Medicine

## 2021-03-01 ENCOUNTER — Other Ambulatory Visit (INDEPENDENT_AMBULATORY_CARE_PROVIDER_SITE_OTHER): Payer: Medicare Other

## 2021-03-01 ENCOUNTER — Other Ambulatory Visit: Payer: Self-pay

## 2021-03-01 DIAGNOSIS — E559 Vitamin D deficiency, unspecified: Secondary | ICD-10-CM | POA: Diagnosis not present

## 2021-03-01 DIAGNOSIS — E785 Hyperlipidemia, unspecified: Secondary | ICD-10-CM

## 2021-03-01 LAB — COMPREHENSIVE METABOLIC PANEL
ALT: 14 U/L (ref 0–35)
AST: 20 U/L (ref 0–37)
Albumin: 4.4 g/dL (ref 3.5–5.2)
Alkaline Phosphatase: 109 U/L (ref 39–117)
BUN: 12 mg/dL (ref 6–23)
CO2: 30 mEq/L (ref 19–32)
Calcium: 10 mg/dL (ref 8.4–10.5)
Chloride: 102 mEq/L (ref 96–112)
Creatinine, Ser: 0.7 mg/dL (ref 0.40–1.20)
GFR: 84.18 mL/min (ref >60.00)
Glucose, Bld: 108 mg/dL — ABNORMAL HIGH (ref 70–99)
Potassium: 4.9 mEq/L (ref 3.5–5.1)
Sodium: 140 mEq/L (ref 135–145)
Total Bilirubin: 0.8 mg/dL (ref 0.2–1.2)
Total Protein: 7.5 g/dL (ref 6.0–8.3)

## 2021-03-01 LAB — VITAMIN D 25 HYDROXY (VIT D DEFICIENCY, FRACTURES): VITD: 60.61 ng/mL (ref 30.00–100.00)

## 2021-03-04 LAB — NMR, LIPOPROFILE
Cholesterol, Total: 261 mg/dL — ABNORMAL HIGH (ref 100–199)
HDL Particle Number: 48.5 umol/L (ref >=30.5)
HDL-C: 81 mg/dL (ref >39)
LDL Particle Number: 1479 nmol/L — ABNORMAL HIGH (ref <1000)
LDL Size: 21.8 nm (ref >20.5)
LDL-C (NIH Calc): 153 mg/dL — ABNORMAL HIGH (ref 0–99)
LP-IR Score: 36 (ref <=45)
Small LDL Particle Number: 260 nmol/L (ref <=527)
Triglycerides: 155 mg/dL — ABNORMAL HIGH (ref 0–149)

## 2021-03-11 ENCOUNTER — Encounter: Payer: Self-pay | Admitting: Family Medicine

## 2021-03-15 DIAGNOSIS — R1084 Generalized abdominal pain: Secondary | ICD-10-CM | POA: Diagnosis not present

## 2021-03-15 DIAGNOSIS — Z87442 Personal history of urinary calculi: Secondary | ICD-10-CM | POA: Diagnosis not present

## 2021-03-17 ENCOUNTER — Other Ambulatory Visit: Payer: Self-pay | Admitting: Family Medicine

## 2021-03-17 MED ORDER — ROSUVASTATIN CALCIUM 5 MG PO TABS: 5.0000 mg | ORAL_TABLET | Freq: Every day | ORAL | 1 refills | Status: DC

## 2021-03-17 NOTE — Progress Notes (Signed)
Spoke with patient about lab results and starting statin. We are going to have her start with just a half tablet of crestor a few days and she will let me know how she does with this.   Will update with blood pressures in a couple of weeks time. Had an elevated reading at urology this week.   Also doesn't want to get follow up MRI. Found old [place] report that stated she had liver cysts in past. She will try to get this to me for review.

## 2021-03-28 ENCOUNTER — Other Ambulatory Visit: Payer: Self-pay | Admitting: Family Medicine

## 2021-03-28 DIAGNOSIS — E785 Hyperlipidemia, unspecified: Secondary | ICD-10-CM

## 2021-04-14 ENCOUNTER — Other Ambulatory Visit: Payer: Self-pay | Admitting: Family Medicine

## 2021-04-14 DIAGNOSIS — G43809 Other migraine, not intractable, without status migrainosus: Secondary | ICD-10-CM

## 2021-04-19 ENCOUNTER — Telehealth: Payer: Self-pay | Admitting: Family Medicine

## 2021-04-19 NOTE — Telephone Encounter (Signed)
Spoke with patient to schedule Medicare Annual Wellness Visit (AWV) either virtually or in office.   Patient declined only wanted to see pcp   AWV-I per [place] 01/01/11  please schedule at anytime with [facility] Nurse Health Advisor 1 or 2   This should be a 45 minute visit.

## 2021-04-23 ENCOUNTER — Other Ambulatory Visit: Payer: Self-pay | Admitting: Family Medicine

## 2021-05-24 IMAGING — DX LEFT SHOULDER - 1 VIEW
2 series · 2 of 2 positions shown · non-contrast
Comparison: None.

CLINICAL DATA: Fall, LEFT shoulder deformity and pain.

EXAM:
LEFT SHOULDER - 1 VIEW

[shoulder ap]
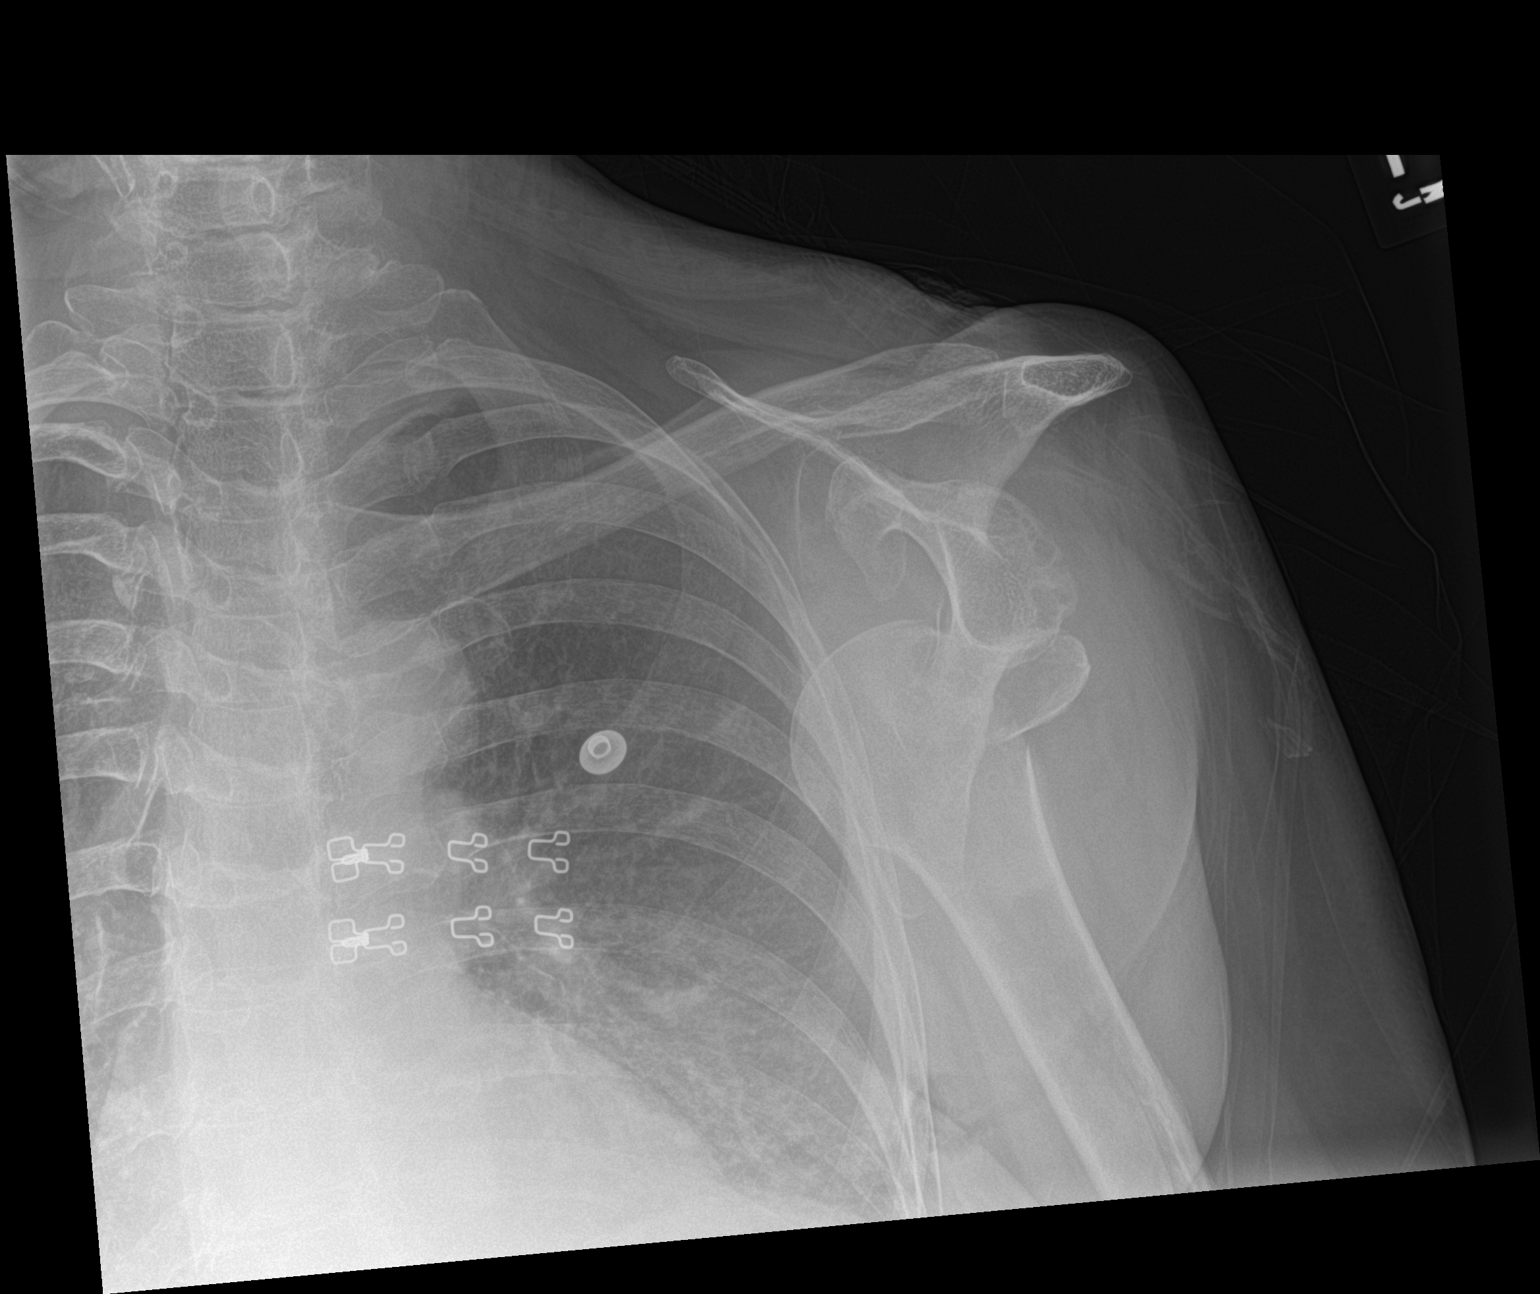

[shoulder swimmer]
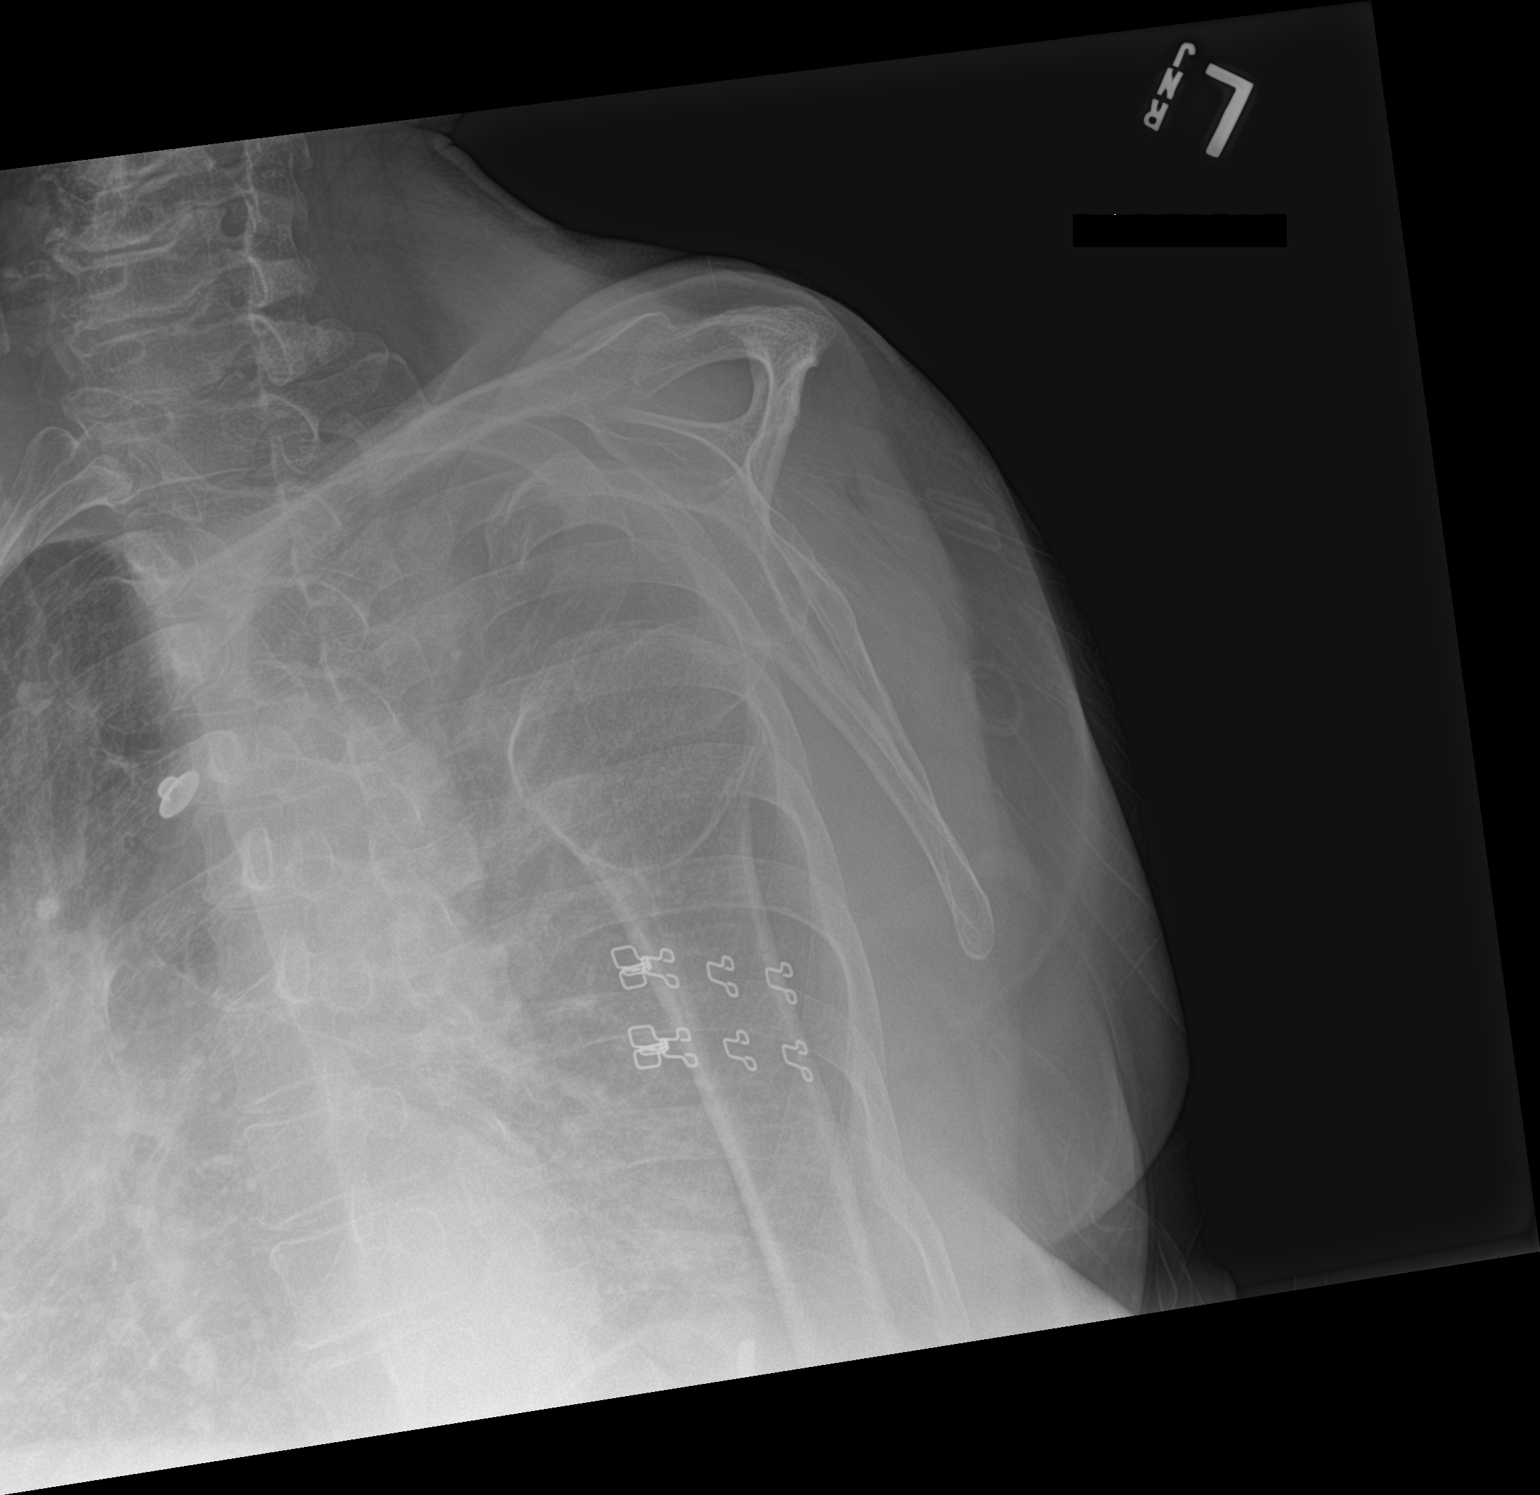

[2 of 2 positions shown; findings below may reference images not displayed]

FINDINGS: Anterior dislocation of the LEFT humeral head. No fracture line or
displaced fracture fragment seen. Chromic clavicular joint space is
normally aligned. Soft tissues about the LEFT shoulder are
unremarkable.
IMPRESSION: Anterior dislocation of the LEFT humeral head.

## 2021-07-02 ENCOUNTER — Encounter: Payer: Self-pay | Admitting: Family Medicine

## 2021-07-22 ENCOUNTER — Other Ambulatory Visit: Payer: Self-pay | Admitting: Family Medicine

## 2021-07-22 DIAGNOSIS — G43809 Other migraine, not intractable, without status migrainosus: Secondary | ICD-10-CM

## 2021-10-21 ENCOUNTER — Telehealth: Payer: Self-pay | Admitting: Family Medicine

## 2021-10-21 MED ORDER — PROPRANOLOL HCL ER 60 MG PO CP24: 60.0000 mg | ORAL_CAPSULE | Freq: Every day | ORAL | 0 refills | Status: DC

## 2021-10-21 NOTE — Telephone Encounter (Signed)
Pt requesting refill of propranolol ER (INDERAL LA) 60 MG 24 hr capsule

## 2021-11-19 ENCOUNTER — Other Ambulatory Visit: Payer: Self-pay | Admitting: Family

## 2021-11-19 ENCOUNTER — Telehealth: Payer: Self-pay | Admitting: Family Medicine

## 2021-11-19 DIAGNOSIS — G43809 Other migraine, not intractable, without status migrainosus: Secondary | ICD-10-CM

## 2021-11-19 MED ORDER — SUMATRIPTAN SUCCINATE 100 MG PO TABS: ORAL_TABLET | ORAL | 0 refills | Status: DC

## 2021-11-19 NOTE — Telephone Encounter (Signed)
Pt request refill of SUMAtriptan (IMITREX) 100 MG tablet [  [id number] [place],  - [address] Phone:  [phone]  Fax:  [phone]

## 2021-11-21 ENCOUNTER — Other Ambulatory Visit: Payer: Self-pay | Admitting: Family

## 2021-11-23 ENCOUNTER — Other Ambulatory Visit: Payer: Self-pay | Admitting: Family

## 2021-11-23 DIAGNOSIS — Z1231 Encounter for screening mammogram for malignant neoplasm of breast: Secondary | ICD-10-CM

## 2021-12-18 ENCOUNTER — Other Ambulatory Visit: Payer: Self-pay | Admitting: Family

## 2021-12-18 DIAGNOSIS — G43809 Other migraine, not intractable, without status migrainosus: Secondary | ICD-10-CM

## 2022-01-06 ENCOUNTER — Ambulatory Visit
Admission: RE | Admit: 2022-01-06 | Discharge: 2022-01-06 | Disposition: A | Discharge status: Home / Self Care | Payer: Medicare Other | Source: Ambulatory Visit | Attending: Family | Admitting: Family

## 2022-01-06 DIAGNOSIS — Z1231 Encounter for screening mammogram for malignant neoplasm of breast: Secondary | ICD-10-CM

## 2022-01-13 DIAGNOSIS — H35032 Hypertensive retinopathy, left eye: Secondary | ICD-10-CM | POA: Diagnosis not present

## 2022-01-13 DIAGNOSIS — H524 Presbyopia: Secondary | ICD-10-CM | POA: Diagnosis not present

## 2022-01-13 DIAGNOSIS — H35342 Macular cyst, hole, or pseudohole, left eye: Secondary | ICD-10-CM | POA: Diagnosis not present

## 2022-01-13 DIAGNOSIS — H35372 Puckering of macula, left eye: Secondary | ICD-10-CM | POA: Diagnosis not present

## 2022-01-13 DIAGNOSIS — H04123 Dry eye syndrome of bilateral lacrimal glands: Secondary | ICD-10-CM | POA: Diagnosis not present

## 2022-01-13 DIAGNOSIS — H35362 Drusen (degenerative) of macula, left eye: Secondary | ICD-10-CM | POA: Diagnosis not present

## 2022-01-25 ENCOUNTER — Encounter: Payer: Self-pay | Admitting: Family Medicine

## 2022-01-25 ENCOUNTER — Ambulatory Visit (INDEPENDENT_AMBULATORY_CARE_PROVIDER_SITE_OTHER): Payer: Medicare Other | Admitting: Family Medicine

## 2022-01-25 VITALS — BP 136/80 | HR 80 | Temp 98.2°F | Ht 62.0 in | Wt 140.5 lb

## 2022-01-25 DIAGNOSIS — Z780 Asymptomatic menopausal state: Secondary | ICD-10-CM | POA: Diagnosis not present

## 2022-01-25 DIAGNOSIS — E559 Vitamin D deficiency, unspecified: Secondary | ICD-10-CM | POA: Diagnosis not present

## 2022-01-25 DIAGNOSIS — Z23 Encounter for immunization: Secondary | ICD-10-CM | POA: Diagnosis not present

## 2022-01-25 DIAGNOSIS — G43809 Other migraine, not intractable, without status migrainosus: Secondary | ICD-10-CM | POA: Diagnosis not present

## 2022-01-25 DIAGNOSIS — I10 Essential (primary) hypertension: Secondary | ICD-10-CM | POA: Insufficient documentation

## 2022-01-25 MED ORDER — SUMATRIPTAN SUCCINATE 100 MG PO TABS: ORAL_TABLET | ORAL | 5 refills | Status: DC

## 2022-01-25 MED ORDER — PROPRANOLOL HCL ER 60 MG PO CP24: 60.0000 mg | ORAL_CAPSULE | Freq: Every day | ORAL | 3 refills | Status: DC

## 2022-01-25 NOTE — Progress Notes (Signed)
Established Patient Office Visit  Subjective   Patient ID: Rebecca Davidson, female    DOB: 1945/08/10  Age: 76 y.o. MRN: [id number]  Chief Complaint  Patient presents with  . [place] is here for transition of care visit.  HTN -- BP in office performed and is well controlled. She reports no side effects to the medications, no chest pain, SOB, dizziness or headaches. She has a BP cuff at home and is checking her BP regularly, reports they are in the normal range.    Vestibular migraine  Vitamin D deficiency  Current Outpatient Medications  Medication Instructions  . cholecalciferol (VITAMIN D3) 1,000 Units, Oral, Daily  . Magnesium 250 mg, Oral, Daily  . Multiple Vitamins-Minerals (CENTRUM ADULTS PO) 1 tablet, Oral, Daily  . propranolol ER (INDERAL LA) 60 mg, Oral, Daily  . SUMAtriptan (IMITREX) 100 MG tablet May repeat in 2 hours if headache persists or recurs.    Patient Active Problem List   Diagnosis Date Noted  . Vestibular migraine 01/25/2022  . HTN (hypertension) 01/25/2022  . Multiple thyroid nodules 04/14/2019  . Nephrolithiasis 03/27/2019  . Vitamin D deficiency 03/27/2019  . Osteopenia 03/27/2019  . 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 Shoulder dislocation 12/25/2018      Review of Systems  All other systems reviewed and are negative.     Objective:     BP 136/80 (BP Location: Right Arm, Patient Position: Sitting, Cuff Size: Normal)   Pulse 80   Temp 98.2 F (36.8 C) (Oral)   Ht 5\' 2"  (1.575 m)   Wt 140 lb 8 oz (63.7 kg)   SpO2 97%   BMI 25.70 kg/m  BP Readings from Last 3 Encounters:  01/25/22 136/80  02/05/21 130/80  02/01/21 140/68      Physical Exam Vitals reviewed.  Constitutional:      Appearance: Normal appearance. She is well-groomed and normal weight.  HENT:     Head: Normocephalic and atraumatic.     Mouth/Throat:     Mouth: Mucous membranes are moist.     Pharynx: Oropharynx is clear.  Eyes:     Extraocular  Movements: Extraocular movements intact.     Conjunctiva/sclera: Conjunctivae normal.     Pupils: Pupils are equal, round, and reactive to light.  Cardiovascular:     Rate and Rhythm: Normal rate and regular rhythm.     Pulses: Normal pulses.     Heart sounds: S1 normal and S2 normal.  Pulmonary:     Effort: Pulmonary effort is normal.     Breath sounds: Normal breath sounds and air entry.  Abdominal:     General: Abdomen is flat. Bowel sounds are normal.     Palpations: Abdomen is soft.  Musculoskeletal:        General: Normal range of motion.     Cervical back: Normal range of motion and neck supple.     Right lower leg: No edema.     Left lower leg: No edema.  Skin:    General: Skin is warm and dry.  Neurological:     Mental Status: She is alert and oriented to person, place, and time. Mental status is at baseline.     Gait: Gait is intact.  Psychiatric:        Mood and Affect: Mood and affect normal.        Speech: Speech normal.        Behavior: Behavior normal.  Judgment: Judgment normal.     No results found for any visits on 01/25/22.  {Labs (Optional):23779}  The 10-year ASCVD risk score (Arnett DK, et al., 2019) is: 29.7%    Assessment & Plan:   Problem List Items Addressed This Visit       Cardiovascular and Mediastinum   Vestibular migraine (Chronic)     Other   Vitamin D deficiency   Other Visit Diagnoses     Need for immunization against influenza    -  Primary   Relevant Orders   Flu Vaccine QUAD High Dose(Fluad) (Completed)       No follow-ups on file.    Karie Georges, MD

## 2022-01-25 NOTE — Patient Instructions (Addendum)
Check blood pressure three times a week, your goal BP is anything less than 140/90. If you notice that your BP is above that consistently, please send me a message over My Chart so we can discuss adding another BP medication.  Try Melatonin 5 mg every night, you may take up to 20 mg if needed.

## 2022-01-26 ENCOUNTER — Other Ambulatory Visit (INDEPENDENT_AMBULATORY_CARE_PROVIDER_SITE_OTHER): Payer: Medicare Other

## 2022-01-26 DIAGNOSIS — I10 Essential (primary) hypertension: Secondary | ICD-10-CM

## 2022-01-26 DIAGNOSIS — E559 Vitamin D deficiency, unspecified: Secondary | ICD-10-CM

## 2022-01-26 LAB — COMPREHENSIVE METABOLIC PANEL
ALT: 17 U/L (ref 0–35)
AST: 24 U/L (ref 0–37)
Albumin: 4.3 g/dL (ref 3.5–5.2)
Alkaline Phosphatase: 88 U/L (ref 39–117)
BUN: 15 mg/dL (ref 6–23)
CO2: 29 mEq/L (ref 19–32)
Calcium: 9.8 mg/dL (ref 8.4–10.5)
Chloride: 100 mEq/L (ref 96–112)
Creatinine, Ser: 0.67 mg/dL (ref 0.40–1.20)
GFR: 84.53 mL/min (ref >60.00)
Glucose, Bld: 102 mg/dL — ABNORMAL HIGH (ref 70–99)
Potassium: 4.1 mEq/L (ref 3.5–5.1)
Sodium: 137 mEq/L (ref 135–145)
Total Bilirubin: 0.9 mg/dL (ref 0.2–1.2)
Total Protein: 7.7 g/dL (ref 6.0–8.3)

## 2022-01-26 LAB — LIPID PANEL
Cholesterol: 254 mg/dL — ABNORMAL HIGH (ref 0–200)
HDL: 70.5 mg/dL (ref >39.00)
LDL Cholesterol: 148 mg/dL — ABNORMAL HIGH (ref 0–99)
NonHDL: 183.68
Total CHOL/HDL Ratio: 4
Triglycerides: 178 mg/dL — ABNORMAL HIGH (ref 0.0–149.0)
VLDL: 35.6 mg/dL (ref 0.0–40.0)

## 2022-01-26 LAB — VITAMIN D 25 HYDROXY (VIT D DEFICIENCY, FRACTURES): VITD: 60.99 ng/mL (ref 30.00–100.00)

## 2022-01-27 NOTE — Assessment & Plan Note (Signed)
Continue daily 1000 unit supplements, she needs a new level today for surveillance. She is also due for a repeat DEXA scan.

## 2022-01-27 NOTE — Assessment & Plan Note (Signed)
On propranolol 60 mg daily and imitrex 100 mg as needed for migraine. This regimen is doing well to control her symptoms, will continue as prescribed.

## 2022-01-27 NOTE — Assessment & Plan Note (Signed)
On propranolol 60 mg ER daily, BP has been borderline in the past, however today she is controlled under 140. I recommended that she continue to check her BP at home regularly and if she is not consistently at her goal then she should call so we can discuss other BP medications to add.

## 2022-06-27 ENCOUNTER — Encounter: Payer: Self-pay | Admitting: Nurse Practitioner

## 2022-07-19 DIAGNOSIS — Z23 Encounter for immunization: Secondary | ICD-10-CM | POA: Diagnosis not present

## 2022-08-04 ENCOUNTER — Encounter: Payer: Self-pay | Admitting: Nurse Practitioner

## 2022-08-04 ENCOUNTER — Ambulatory Visit: Payer: Medicare Other | Admitting: Nurse Practitioner

## 2022-08-04 VITALS — BP 122/80 | HR 76 | Ht 62.0 in | Wt 122.0 lb

## 2022-08-04 DIAGNOSIS — K625 Hemorrhage of anus and rectum: Secondary | ICD-10-CM

## 2022-08-04 DIAGNOSIS — K648 Other hemorrhoids: Secondary | ICD-10-CM

## 2022-08-04 MED ORDER — HYDROCORTISONE (PERIANAL) 2.5 % EX CREA: 1.0000 | TOPICAL_CREAM | Freq: Every day | CUTANEOUS | 1 refills | Status: DC

## 2022-08-04 NOTE — Patient Instructions (Signed)
_______________________________________________________  If your blood pressure at your visit was 140/90 or greater, please contact your primary care physician to follow up on this.  _______________________________________________________  If you are age 78 or older, your body mass index should be between 23-30. Your Body mass index is 22.31 kg/m. If this is out of the aforementioned range listed, please consider follow up with your Primary Care Provider.  If you are age 81 or younger, your body mass index should be between 19-25. Your Body mass index is 22.31 kg/m. If this is out of the aformentioned range listed, please consider follow up with your Primary Care Provider.   ________________________________________________________  The [facility] providers would like to encourage you to use [facility] to communicate with providers for non-urgent requests or questions.  Due to long hold times on the telephone, sending your provider a message by [facility] may be a faster and more efficient way to get a response.  Please allow 48 business hours for a response.  Please remember that this is for non-urgent requests.  _______________________________________________________  We have sent the following medications to your pharmacy for you to pick up at your convenience:  Anusol HC cream - once a day for 10 days.  60 ounces of water daily 1-2 stool softeners at bedtime Glycerin suppositories as needed

## 2022-08-04 NOTE — Progress Notes (Signed)
____________________________________________________________  Attending physician addendum:  Thank you for sending this case to me. I have reviewed the entire note and agree with the plan.  Please check a CBC at upcoming visit if not done with PCP within last few months.  Wilfrid Lund, MD  ____________________________________________________________

## 2022-08-04 NOTE — Progress Notes (Signed)
Assessment:   77 y.o. yo female with the following:   Chronic intermittent perianal discomfort and rectal bleeding. Suspect internal hemorrhoid bleeding.  -External hemorrhoid tags present. Suspect external hemorrhoids flare up from time to time and cause discomfort but the bleeding is most likely from internal hemorrhoids.   Colon cancer screening.  Negative cologuard in Dec 2020   Plan:   -Anusol HC cream - apply once daily inside rectum for 10 days. -60 ounces of water daily -1-2 stool softeners at bedtime -Glycerin suppositories as needed -We discussed that bleeding is probably hemorrhoidal but other lesions such as colon polyp/neoplasm remain on the list of possibilities.  She would like to try above measures prior to proceeding with a diagnostic colonoscopy.  -Follow-up with me in 3 to 4 weeks . Plan for diagnostic colonoscopy if symptoms persist.     History of Present Illness   Chief Complaint: hemorrhoids  Rebecca Davidson is a 77 y.o. year old female with a past medical history of kidney stones and headaches. See PMH / [place] for additional history.   Patient is new to the practice, self-referred for hemorrhoids discomfort and bleeding. Symptoms started a about two years ago and are intermittent. She has tried witch hazel, sitz baths and steroid creams. She has also taken metamucil but it caused her to have excessive number of bowel movements . Her stools are soft. She doesn't strain.  Last colonoscopy was at age 44 ( negative). Cologuard negative Dec 2020. No [place] of colon cancer. No other GI complaints  Previous Labs / Imaging::    Latest Ref Rng & Units 02/01/2021   12:15 AM 06/26/2020   11:05 AM 04/08/2019    9:53 AM  CBC  WBC 4.0 - 10.5 K/uL 7.9  7.8  8.5   Hemoglobin 12.0 - 15.0 g/dL 14.0  14.1  13.5   Hematocrit 36.0 - 46.0 % 39.6  40.9  40.1   Platelets 150 - 400 K/uL 297  327.0  314.0     No results found for: "LIPASE"    Latest Ref Rng & Units 01/26/2022     7:08 AM 03/01/2021    7:57 AM 02/01/2021   12:15 AM  CMP  Glucose 70 - 99 mg/dL 102  108  124   BUN 6 - 23 mg/dL 15  12  13    Creatinine 0.40 - 1.20 mg/dL 0.67  0.70  0.65   Sodium 135 - 145 mEq/L 137  140  137   Potassium 3.5 - 5.1 mEq/L 4.1  4.9  4.1   Chloride 96 - 112 mEq/L 100  102  105   CO2 19 - 32 mEq/L 29  30  23    Calcium 8.4 - 10.5 mg/dL 9.8  10.0  9.2   Total Protein 6.0 - 8.3 g/dL 7.7  7.5    Total Bilirubin 0.2 - 1.2 mg/dL 0.9  0.8    Alkaline Phos 39 - 117 U/L 88  109    AST 0 - 37 U/L 24  20    ALT 0 - 35 U/L 17  14       Past Medical History:  Diagnosis Date   Chronic headaches    Hypercholesteremia    Hypertension    Kidney stone    Nephrolithiasis    Thyroid disease    Past Surgical History:  Procedure Laterality Date   APPENDECTOMY N/A 07/10/1998   BREAST BIOPSY Right    oophorectomy Bilateral  ovaries and tubes   TUBAL LIGATION     Family History  Problem Relation Age of Onset   Alzheimer's disease Mother    Diabetes Mother    Aneurysm Father    Hypertension Sister    Hyperlipidemia Sister    Breast cancer Sister    Heart disease Maternal Grandfather    Crohn's disease Daughter    Celiac disease Son    Colon cancer Neg Hx    Esophageal cancer Neg Hx    Stomach cancer Neg Hx    Social History   Tobacco Use   Smoking status: Never   Smokeless tobacco: Never  Vaping Use   Vaping Use: Never used  Substance Use Topics   Alcohol use: Not Currently   Current Outpatient Medications  Medication Sig Dispense Refill   cholecalciferol (VITAMIN D3) 25 MCG (1000 UT) tablet Take 1,000 Units by mouth daily.     Magnesium 250 MG TABS Take 250 mg by mouth daily.     Multiple Vitamins-Minerals (CENTRUM ADULTS PO) Take 1 tablet by mouth daily.     propranolol ER (INDERAL LA) 60 MG 24 hr capsule Take 1 capsule (60 mg total) by mouth daily. 90 capsule 3   SUMAtriptan (IMITREX) 100 MG tablet May repeat in 2 hours if headache persists or recurs.  (Patient not taking: Reported on 08/04/2022) 10 tablet 5   No current facility-administered medications for this visit.   Allergies  Allergen Reactions   Ciprofloxacin     Made her sick    Macrobid [Nitrofurantoin]     Makes her sick   Myrbetriq [Mirabegron] Nausea Only   Doxycycline Rash   Sulfa Antibiotics Swelling and Rash     Review of Systems: Positive for headaches, sleeping problems.  All other systems reviewed and negative except where noted in HPI.   Wt Readings from Last 3 Encounters:  08/04/22 122 lb (55.3 kg)  01/25/22 140 lb 8 oz (63.7 kg)  02/05/21 138 lb 14.4 oz (63 kg)    Physical Exam:  BP 122/80   Pulse 76   Ht 5\' 2"  (1.575 m)   Wt 122 lb (55.3 kg)   BMI 22.31 kg/m  Constitutional:  Pleasant, generally well appearing female in no acute distress. Psychiatric:  Normal mood and affect. Behavior is normal. EENT: Pupils normal.  Conjunctivae are normal. No scleral icterus. Neck supple.  Cardiovascular: Normal rate, regular rhythm.  Pulmonary/chest: Effort normal and breath sounds normal. No wheezing, rales or rhonchi. Abdominal: Soft, nondistended, nontender. Bowel sounds active throughout. There are no masses palpable. No hepatomegaly. Rectal: external hemorrhoid tags. On anoscopy there are swollen internal hemorrhoids present Neurological: Alert and oriented to person place and time.   Tye Savoy, NP  08/04/2022, 8:37 AM

## 2022-08-31 ENCOUNTER — Ambulatory Visit: Payer: Medicare Other | Admitting: Nurse Practitioner

## 2022-08-31 ENCOUNTER — Encounter: Payer: Self-pay | Admitting: Nurse Practitioner

## 2022-08-31 ENCOUNTER — Other Ambulatory Visit (INDEPENDENT_AMBULATORY_CARE_PROVIDER_SITE_OTHER): Payer: Medicare Other

## 2022-08-31 VITALS — BP 118/68 | HR 69 | Ht 62.0 in | Wt 121.0 lb

## 2022-08-31 DIAGNOSIS — R194 Change in bowel habit: Secondary | ICD-10-CM

## 2022-08-31 DIAGNOSIS — K625 Hemorrhage of anus and rectum: Secondary | ICD-10-CM

## 2022-08-31 DIAGNOSIS — K648 Other hemorrhoids: Secondary | ICD-10-CM

## 2022-08-31 DIAGNOSIS — K644 Residual hemorrhoidal skin tags: Secondary | ICD-10-CM

## 2022-08-31 LAB — CBC
HCT: 40.3 % (ref 36.0–46.0)
Hemoglobin: 13.8 g/dL (ref 12.0–15.0)
MCHC: 34.3 g/dL (ref 30.0–36.0)
MCV: 91.6 fl (ref 78.0–100.0)
Platelets: 337 10*3/uL (ref 150.0–400.0)
RBC: 4.4 Mil/uL (ref 3.87–5.11)
RDW: 12.5 % (ref 11.5–15.5)
WBC: 9 10*3/uL (ref 4.0–10.5)

## 2022-08-31 NOTE — Patient Instructions (Addendum)
_______________________________________________________  If your blood pressure at your visit was 140/90 or greater, please contact your primary care physician to follow up on this.  _______________________________________________________  If you are age 78 or older, your body mass index should be between 23-30. Your Body mass index is 22.13 kg/m. If this is out of the aforementioned range listed, please consider follow up with your Primary Care Provider.  If you are age 89 or younger, your body mass index should be between 19-25. Your Body mass index is 22.13 kg/m. If this is out of the aformentioned range listed, please consider follow up with your Primary Care Provider.   ________________________________________________________  The [facility] providers would like to encourage you to use [facility] to communicate with providers for non-urgent requests or questions.  Due to long hold times on the telephone, sending your provider a message by [facility] may be a faster and more efficient way to get a response.  Please allow 48 business hours for a response.  Please remember that this is for non-urgent requests.  _______________________________________________________  Your provider has requested that you go to the basement level for lab work before leaving today. Press "B" on the elevator. The lab is located at the first door on the left as you exit the elevator.  Due to recent changes in healthcare laws, you may see the results of your imaging and laboratory studies on MyChart before your provider has had a chance to review them.  We understand that in some cases there may be results that are confusing or concerning to you. Not all laboratory results come back in the same time frame and the provider may be waiting for multiple results in order to interpret others.  Please give [place] 48 hours in order for your provider to thoroughly review all the results before contacting the office for clarification of  your results.    DISCONTINUE: Colace  Will discuss plan with Dr Myrtie Neither and call you with further instructions.  High-Fiber Eating Plan Fiber, also called dietary fiber, is a type of carbohydrate. It is found foods such as fruits, vegetables, whole grains, and beans. A high-fiber diet can have many health benefits. Your health care provider may recommend a high-fiber diet to help: Prevent constipation. Fiber can make your bowel movements more regular. Lower your cholesterol. Relieve the following conditions: Inflammation of veins in the anus (hemorrhoids). Inflammation of specific areas of the digestive tract (uncomplicated diverticulosis). A problem of the large intestine, also called the colon, that sometimes causes pain and diarrhea (irritable bowel syndrome, or IBS). Prevent overeating as part of a weight-loss plan. Prevent heart disease, type 2 diabetes, and certain cancers. What are tips for following this plan? Reading food labels  Check the nutrition facts label on food products for the amount of dietary fiber. Choose foods that have 5 grams of fiber or more per serving. The goals for recommended daily fiber intake include: Men (age 73 or younger): 34-38 g. Men (over age 24): 28-34 g. Women (age 45 or younger): 25-28 g. Women (over age 43): 22-25 g. Your daily fiber goal is _____________ g. Shopping Choose whole fruits and vegetables instead of processed forms, such as apple juice or applesauce. Choose a wide variety of high-fiber foods such as avocados, lentils, oats, and kidney beans. Read the nutrition facts label of the foods you choose. Be aware of foods with added fiber. These foods often have high sugar and sodium amounts per serving. Cooking Use whole-grain flour for baking and  cooking. Cook with brown rice instead of white rice. Meal planning Start the day with a breakfast that is high in fiber, such as a cereal that contains 5 g of fiber or more per serving. Eat  breads and cereals that are made with whole-grain flour instead of refined flour or white flour. Eat brown rice, bulgur wheat, or millet instead of white rice. Use beans in place of meat in soups, salads, and pasta dishes. Be sure that half of the grains you eat each day are whole grains. General information You can get the recommended daily intake of dietary fiber by: Eating a variety of fruits, vegetables, grains, nuts, and beans. Taking a fiber supplement if you are not able to take in enough fiber in your diet. It is better to get fiber through food than from a supplement. Gradually increase how much fiber you consume. If you increase your intake of dietary fiber too quickly, you may have bloating, cramping, or gas. Drink plenty of water to help you digest fiber. Choose high-fiber snacks, such as berries, raw vegetables, nuts, and popcorn. What foods should I eat? Fruits Berries. Pears. Apples. Oranges. Avocado. Prunes and raisins. Dried figs. Vegetables Sweet potatoes. Spinach. Kale. Artichokes. Cabbage. Broccoli. Cauliflower. Green peas. Carrots. Squash. Grains Whole-grain breads. Multigrain cereal. Oats and oatmeal. Brown rice. Barley. Bulgur wheat. Millet. Quinoa. Bran muffins. Popcorn. Rye wafer crackers. Meats and other proteins Navy beans, kidney beans, and pinto beans. Soybeans. Split peas. Lentils. Nuts and seeds. Dairy Fiber-fortified yogurt. Beverages Fiber-fortified soy milk. Fiber-fortified orange juice. Other foods Fiber bars. The items listed above may not be a complete list of recommended foods and beverages. Contact a dietitian for more information. What foods should I avoid? Fruits Fruit juice. Cooked, strained fruit. Vegetables Fried potatoes. Canned vegetables. Well-cooked vegetables. Grains White bread. Pasta made with refined flour. White rice. Meats and other proteins Fatty cuts of meat. Fried chicken or fried fish. Dairy Milk. Yogurt. Cream cheese.  Sour cream. Fats and oils Butters. Beverages Soft drinks. Other foods Cakes and pastries. The items listed above may not be a complete list of foods and beverages to avoid. Talk with your dietitian about what choices are best for you. Summary Fiber is a type of carbohydrate. It is found in foods such as fruits, vegetables, whole grains, and beans. A high-fiber diet has many benefits. It can help to prevent constipation, lower blood cholesterol, aid weight loss, and reduce your risk of heart disease, diabetes, and certain cancers. Increase your intake of fiber gradually. Increasing fiber too quickly may cause cramping, bloating, and gas. Drink plenty of water while you increase the amount of fiber you consume. The best sources of fiber include whole fruits and vegetables, whole grains, nuts, seeds, and beans. This information is not intended to replace advice given to you by your health care provider. Make sure you discuss any questions you have with your health care provider. Document Revised: 08/22/2019 Document Reviewed: 08/22/2019 Elsevier Patient Education  2023 Elsevier Inc.  Thank you for entrusting me with your care and choosing [facility].  Willette Cluster, NP

## 2022-08-31 NOTE — Progress Notes (Signed)
Assessment and Plan   Primary Gi: Rebecca Jupiter, MD  Brief Narrative:  77 y.o. yo female with a past medical history of, but not limited to kidney stones and headaches.   Chronic intermittent perianal discomfort and rectal bleeding with bowel movements. Symptoms are most likely secondary to  known internal and external hemorrhoids seen on exam.  However, she hasn't had any improvement with Anusol cream.  -She understands that colon polyps / neoplasm are always possible but strongly prefers not to undergo a colonoscopy. She wants to know if we would be willing to band internal hemorrhoids without doing a colonoscopy first. I will discuss with Dr. Myrtie Davidson and get back to [place].  -CBC today  Altered bowel habits.  Generally her stools are of mushy consistency but sometimes has a hard stool with straining. Difficult time regulating stool consistency. Fiber supplements cause diarrhea as does just one stool softener a day.   -Stop stool softener.  -Trial of a high fiber diet if can tolerate -Glycerin suppositories as needed for constipation / straining   History of Present Illness   Chief complaint:  hemorrhoid follow up   Ms Rebecca Davidson was seen here as a new patient 08/04/22 for evaluation of perianal discomfort and rectal bleeding.  Her stools were soft and she wasn't straining but had internal and external hemorrhoids on exam / anoscopy . Refer to that office note for detail. In summary, symptoms felt to be secondary to hemorrhoids. Treated with Anusol cream, stool softeners, glycerin suppositories as needed. Here for follow up.   Interval History:  She took one colace daily and it gave her diarrhea which further irritated her hemorrhoids. She cut back dose to a couple of times a week. Now, most of her BMs are mushy consistency. If she skips a day then stool will be hard and lead to straining.  She has been using the Anusol cream inside rectum and also on external hemorrhoids but hasn't noticed any  improvement in rectal pain or bleeding.  The bleeding occurs even when stools are not hard.  She has had this rectal bleeding and discomfort for ~ 2 years. She isn't worried about colon cancer. She feels certain symptoms are related to hemorrhoids   Rebecca Davidson has no other GI complaints.  She has no abdominal pain.  Her weight is stable.    Previous GI Endoscopies / Labs / Imaging   Negative cologuard in Dec 2020     Latest Ref Rng & Units 01/26/2022    7:08 AM 03/01/2021    7:57 AM 06/26/2020   11:05 AM  Hepatic Function  Total Protein 6.0 - 8.3 g/dL 7.7  7.5  7.4   Albumin 3.5 - 5.2 g/dL 4.3  4.4  4.5   AST 0 - 37 U/L 24  20  20    ALT 0 - 35 U/L 17  14  13    Alk Phosphatase 39 - 117 U/L 88  109  87   Total Bilirubin 0.2 - 1.2 mg/dL 0.9  0.8  0.9        Latest Ref Rng & Units 02/01/2021   12:15 AM 06/26/2020   11:05 AM 04/08/2019    9:53 AM  CBC  WBC 4.0 - 10.5 K/uL 7.9  7.8  8.5   Hemoglobin 12.0 - 15.0 g/dL 16.1  09.6  04.5   Hematocrit 36.0 - 46.0 % 39.6  40.9  40.1   Platelets 150 - 400 K/uL 297  327.0  314.0  Past Medical History:  Diagnosis Date   Chronic headaches    Hypercholesteremia    Hypertension    Kidney stone    Nephrolithiasis    Thyroid disease     Past Surgical History:  Procedure Laterality Date   APPENDECTOMY N/A 07/10/1998   BREAST BIOPSY Right    oophorectomy Bilateral    ovaries and tubes   TUBAL LIGATION      Current Medications, Allergies, Family History and Social History were reviewed in [employer] record.     Current Outpatient Medications  Medication Sig Dispense Refill   cholecalciferol (VITAMIN D3) 25 MCG (1000 UT) tablet Take 1,000 Units by mouth daily.     hydrocortisone (ANUSOL-HC) 2.5 % rectal cream Place 1 Application rectally daily. 30 g 1   Magnesium 250 MG TABS Take 250 mg by mouth daily.     melatonin 5 MG TABS Take 5 mg by mouth at bedtime as needed.     Multiple Vitamins-Minerals (CENTRUM ADULTS  PO) Take 1 tablet by mouth daily.     propranolol ER (INDERAL LA) 60 MG 24 hr capsule Take 1 capsule (60 mg total) by mouth daily. 90 capsule 3   SUMAtriptan (IMITREX) 100 MG tablet May repeat in 2 hours if headache persists or recurs. 10 tablet 5   No current facility-administered medications for this visit.    Review of Systems: No chest pain. No shortness of breath. No urinary complaints.    Physical Exam  Wt Readings from Last 3 Encounters:  08/31/22 121 lb (54.9 kg)  08/04/22 122 lb (55.3 kg)  01/25/22 140 lb 8 oz (63.7 kg)    Ht 5\' 2"  (1.575 m)   Wt 121 lb (54.9 kg)   BMI 22.13 kg/m  Constitutional:  Pleasant, generally well appearing female in no acute distress. Psychiatric: Normal mood and affect. Behavior is normal. EENT: Pupils normal.  Conjunctivae are normal. No scleral icterus. Neck supple.  Cardiovascular: Normal rate, regular rhythm.  Pulmonary/chest: Effort normal and breath sounds normal. No wheezing, rales or rhonchi. Abdominal: Soft, nondistended, nontender. Bowel sounds active throughout. There are no masses palpable. No hepatomegaly. Rectal: small mildly inflamed external hemorrhoids. No obvious masses on DRE.  Neurological: Alert and oriented to person place and time.  Skin: Skin is warm and dry. No rashes noted.  I spent 30 minutes total reviewing records, obtaining history, performing exam, counseling patient and documenting visit / findings.   Rebecca Davidson, Rebecca Davidson  08/31/2022, 1:25 PM  Cc:  Rebecca Georges, MD

## 2022-09-01 ENCOUNTER — Telehealth: Payer: Self-pay | Admitting: Family Medicine

## 2022-09-01 ENCOUNTER — Other Ambulatory Visit: Payer: Self-pay | Admitting: Family Medicine

## 2022-09-01 DIAGNOSIS — Z780 Asymptomatic menopausal state: Secondary | ICD-10-CM

## 2022-09-01 DIAGNOSIS — Z1231 Encounter for screening mammogram for malignant neoplasm of breast: Secondary | ICD-10-CM

## 2022-09-01 NOTE — Progress Notes (Signed)
____________________________________________________________  Attending physician addendum:  Thank you for sending this case to me. I have reviewed the entire note and agree with the plan.  Please arrange her next clinic follow up with me, at which time we can most likely perform the first banding session if my exam suggests the need for that and she wishes to proceed.  [name], MD  ____________________________________________________________

## 2022-09-01 NOTE — Telephone Encounter (Signed)
Bone density order will expire prior to her being able to get scheduled. Requesting a new order for bone density and to be alerted via mychart when that is placed.

## 2022-09-01 NOTE — Telephone Encounter (Signed)
Ok to place order -- use postmenopausal state for the dx

## 2022-09-06 ENCOUNTER — Telehealth: Payer: Self-pay

## 2022-09-06 NOTE — Telephone Encounter (Signed)
-----   Message from Meredith Pel, NP sent at 09/05/2022  5:50 PM EDT ----- Waynetta Sandy, please see my previous staff message to you.  This is the lady that Danis said he would probably be willing to band her hemorrhoids without repeating a colonoscopy.  I was trying to get her in for an appointment with him on one of his banding days.  Let me know if this did not answer your previous question.  Thanks

## 2022-09-06 NOTE — Telephone Encounter (Signed)
Spoke with the patient. She agrees to 11/24/22 at 8:20 am. She is not taking anticoagulation medications. She is not planning any travel for a few weeks after her procedure.  She does request to be moved to a sooner appointment if something becomes available.

## 2022-09-24 DIAGNOSIS — J029 Acute pharyngitis, unspecified: Secondary | ICD-10-CM | POA: Diagnosis not present

## 2022-09-24 DIAGNOSIS — Z20822 Contact with and (suspected) exposure to covid-19: Secondary | ICD-10-CM | POA: Diagnosis not present

## 2022-10-04 DIAGNOSIS — M940 Chondrocostal junction syndrome [Tietze]: Secondary | ICD-10-CM | POA: Diagnosis not present

## 2022-11-24 ENCOUNTER — Ambulatory Visit: Payer: Medicare Other | Admitting: Gastroenterology

## 2022-11-24 ENCOUNTER — Encounter: Payer: Self-pay | Admitting: Gastroenterology

## 2022-11-24 VITALS — BP 128/82 | HR 67 | Ht 62.0 in | Wt 115.4 lb

## 2022-11-24 DIAGNOSIS — K648 Other hemorrhoids: Secondary | ICD-10-CM

## 2022-11-24 NOTE — Progress Notes (Signed)
[facility] Progress Note  Chief Complaint: Symptomatic internal hemorrhoids  Subjective  History: See April and May 2024 APP clinic notes for details.  About 2 years of symptomatic hemorrhoids, irregular BMs.  Either firm stool or mushy if takes even a single dose of stool softener or laxative.  Last colonoscopy at least 15 years ago.  Negative Cologuard 2020, thus far has declined colonoscopy.  Failed topical therapies for symptomatic hemorrhoids.  Rebecca Davidson has had ongoing perianal swelling, bleeding and pain from her hemorrhoids.  Bowel habits remain as previously described.  She is interested in hemorrhoidal banding.  I described the procedure in detail along with risks and benefits and she wished to proceed.  We also discussed the possibility of another colorectal process such as colitis/proctitis or malignancy causing the symptoms, though they seem much less likely.  She is willing to accept that uncertainty and does not wish to proceed with colonoscopy at this time. Her husband was present for that visit, then left the room for her banding.  My MA Elon Spanner was present for the entire exam and hemorrhoidal banding.  ROS: Cardiovascular:  no chest pain Respiratory: no dyspnea  The patient's Past Medical, Family and Social History were reviewed and are on file in the EMR.  Objective:  Med list reviewed  Current Outpatient Medications:    cholecalciferol (VITAMIN D3) 25 MCG (1000 UT) tablet, Take 1,000 Units by mouth daily., Disp: , Rfl:    Magnesium 250 MG TABS, Take 250 mg by mouth daily., Disp: , Rfl:    melatonin 5 MG TABS, Take 5 mg by mouth at bedtime as needed., Disp: , Rfl:    Multiple Vitamins-Minerals (CENTRUM ADULTS PO), Take 1 tablet by mouth daily., Disp: , Rfl:    propranolol ER (INDERAL LA) 60 MG 24 hr capsule, Take 1 capsule (60 mg total) by mouth daily., Disp: 90 capsule, Rfl: 3   SUMAtriptan (IMITREX) 100 MG tablet, May repeat in 2 hours if headache persists or  recurs., Disp: 10 tablet, Rfl: 5   Vital signs in last 24 hrs: Vitals:   11/24/22 0810  BP: 128/82  Pulse: 67  SpO2: 99%   Wt Readings from Last 3 Encounters:  11/24/22 115 lb 6 oz (52.3 kg)  08/31/22 121 lb (54.9 kg)  08/04/22 122 lb (55.3 kg)    Physical Exam  She is well-appearing. Abdomen: soft, no tenderness, with active bowel sounds. No guarding or palpable hepatosplenomegaly. Perianal exam reveals a prolapsed swollen internal hemorrhoid. DRE with normal sphincter tone, no palpable internal lesions. Anoscopy reveals swollen internal hemorrhoids, primarily right-sided, and the RA column was the most prominent as well as the one that was seen prolapsing.  (Grade 2)  PROCEDURE NOTE: The patient presents with symptomatic grade 2  hemorrhoids, requesting rubber band ligation of his/her hemorrhoidal disease.  All risks, benefits and alternative forms of therapy were described and informed consent was obtained.    The anorectum was pre-medicated with 0.125% NTG and lubricant. The decision was made to band the RA internal hemorrhoids, and the [facility] System was used to perform band ligation without complication.  Digital anorectal examination was then performed to assure proper positioning of the band, and to adjust the banded tissue as required.  The patient was discharged home without pain or other issues.  Dietary and behavioral recommendations were given and along with follow-up instructions.     The following adjunctive treatments were recommended:  None  The patient will return  several weeks for  follow-up and banding of the RP internal hemorrhoid column No complications were encountered and the patient tolerated the procedure well.  [name], MD   _____________________________________________ Assessment & Plan  Assessment: Encounter Diagnosis  Name Primary?   Bleeding internal hemorrhoids Yes   See above  Rebecca Davidson

## 2022-11-24 NOTE — Patient Instructions (Addendum)
_______________________________________________________  If your blood pressure at your visit was 140/90 or greater, please contact your primary care physician to follow up on this.  _______________________________________________________  If you are age 78 or older, your body mass index should be between 23-30. Your Body mass index is 21.1 kg/m. If this is out of the aforementioned range listed, please consider follow up with your Primary Care Provider.  If you are age 62 or younger, your body mass index should be between 19-25. Your Body mass index is 21.1 kg/m. If this is out of the aformentioned range listed, please consider follow up with your Primary Care Provider.   ________________________________________________________  The [facility] providers would like to encourage you to use [facility] to communicate with providers for non-urgent requests or questions.  Due to long hold times on the telephone, sending your provider a message by [facility] may be a faster and more efficient way to get a response.  Please allow 48 business hours for a response.  Please remember that this is for non-urgent requests.   HEMORRHOID BANDING PROCEDURE    FOLLOW-UP CARE   The procedure you have had should have been relatively painless since the banding of the area involved does not have nerve endings and there is no pain sensation.  The rubber band cuts off the blood supply to the hemorrhoid and the band may fall off as soon as 48 hours after the banding (the band may occasionally be seen in the toilet bowl following a bowel movement). You may notice a temporary feeling of fullness in the rectum which should respond adequately to plain Tylenol or Motrin.  Following the banding, avoid strenuous exercise that evening and resume full activity the next day.  A sitz bath (soaking in a warm tub) or bidet is soothing, and can be useful for cleansing the area after bowel movements.     To avoid constipation, take  two tablespoons of natural wheat bran, natural oat bran, flax, Benefiber or any over the counter fiber supplement and increase your water intake to 7-8 glasses daily.    Unless you have been prescribed anorectal medication, do not put anything inside your rectum for two weeks: No suppositories, enemas, fingers, etc.  Occasionally, you may have more bleeding than usual after the banding procedure.  This is often from the untreated hemorrhoids rather than the treated one.  Don't be concerned if there is a tablespoon or so of blood.  If there is more blood than this, lie flat with your bottom higher than your head and apply an ice pack to the area. If the bleeding does not stop within a half an hour or if you feel faint, call our office at [phone] or go to the emergency room.  Problems are not common; however, if there is a substantial amount of bleeding, severe pain, chills, fever or difficulty passing urine (very rare) or other problems, you should call us at [phone] or report to the nearest emergency room.  Do not stay seated continuously for more than 2-3 hours for a day or two after the procedure.  Tighten your buttock muscles 10-15 times every two hours and take 10-15 deep breaths every 1-2 hours.  Do not spend more than a few minutes on the toilet if you cannot empty your bowel; instead re-visit the toilet at a later time.   Banding number 2 has been scheduled for September 13th at 4 pm    It was a pleasure to see  you today!  Thank you for trusting me with your gastrointestinal care!

## 2022-12-27 ENCOUNTER — Telehealth: Payer: Self-pay | Admitting: Gastroenterology

## 2022-12-27 NOTE — Telephone Encounter (Signed)
Hi Dr. Myrtie Neither,  Patient called and wanted to extend her gratitude for being able to help her with the 1st banding procedure. States she is doing well so she will be postponing her 2nd banding for now. York Spaniel Thank you so much!

## 2022-12-27 NOTE — Telephone Encounter (Signed)
Glad to hear it, and thanks for the update.  Please take her off the clinic schedule if not done already.  H Danis

## 2023-01-13 ENCOUNTER — Encounter: Payer: Medicare Other | Admitting: Gastroenterology

## 2023-01-20 DIAGNOSIS — Z961 Presence of intraocular lens: Secondary | ICD-10-CM | POA: Diagnosis not present

## 2023-01-20 DIAGNOSIS — H04123 Dry eye syndrome of bilateral lacrimal glands: Secondary | ICD-10-CM | POA: Diagnosis not present

## 2023-01-20 DIAGNOSIS — H35372 Puckering of macula, left eye: Secondary | ICD-10-CM | POA: Diagnosis not present

## 2023-01-20 DIAGNOSIS — H35342 Macular cyst, hole, or pseudohole, left eye: Secondary | ICD-10-CM | POA: Diagnosis not present

## 2023-01-26 ENCOUNTER — Other Ambulatory Visit: Payer: Self-pay | Admitting: Family Medicine

## 2023-01-26 DIAGNOSIS — G43809 Other migraine, not intractable, without status migrainosus: Secondary | ICD-10-CM

## 2023-02-16 DIAGNOSIS — L03116 Cellulitis of left lower limb: Secondary | ICD-10-CM | POA: Diagnosis not present

## 2023-02-16 DIAGNOSIS — L03115 Cellulitis of right lower limb: Secondary | ICD-10-CM | POA: Diagnosis not present

## 2023-02-25 DIAGNOSIS — Z23 Encounter for immunization: Secondary | ICD-10-CM | POA: Diagnosis not present

## 2023-03-23 ENCOUNTER — Ambulatory Visit
Admission: RE | Admit: 2023-03-23 | Discharge: 2023-03-23 | Disposition: A | Discharge status: Home / Self Care | Payer: Medicare Other | Source: Ambulatory Visit | Attending: Family Medicine | Admitting: Family Medicine

## 2023-03-23 DIAGNOSIS — Z1231 Encounter for screening mammogram for malignant neoplasm of breast: Secondary | ICD-10-CM

## 2023-03-23 DIAGNOSIS — Z780 Asymptomatic menopausal state: Secondary | ICD-10-CM

## 2023-03-23 DIAGNOSIS — M8588 Other specified disorders of bone density and structure, other site: Secondary | ICD-10-CM | POA: Diagnosis not present

## 2023-03-24 ENCOUNTER — Encounter: Payer: Self-pay | Admitting: Family Medicine

## 2023-04-22 ENCOUNTER — Other Ambulatory Visit: Payer: Self-pay | Admitting: Family Medicine

## 2023-04-22 DIAGNOSIS — G43809 Other migraine, not intractable, without status migrainosus: Secondary | ICD-10-CM

## 2023-05-14 DIAGNOSIS — N390 Urinary tract infection, site not specified: Secondary | ICD-10-CM | POA: Diagnosis not present

## 2023-05-15 ENCOUNTER — Ambulatory Visit: Payer: Self-pay | Admitting: Family Medicine

## 2023-05-15 NOTE — Telephone Encounter (Signed)
  Chief Complaint: Leg pain/numbness Symptoms: Numbness, pain Frequency: Ongoing for 6-7 months, worsening over the last 2 months Pertinent Negatives: Patient denies Weakness, rash, redness Disposition: [] ED /[] Urgent Care (no appt availability in office) / [x] Appointment(In office/virtual)/ []  [facility]/ [] Home Care/ [] Refused Recommended Disposition /[] [facility]/ []  Follow-up with PCP Additional Notes: Pt reports she noted some itching in the top part of her left upper thigh about 6-7 months ago, she noted it gradually became numb in the area where the itching began and in the last 2 months she has developed severe pain that pt reports at 15/10 at it's worst. Pt reports all home remedies and OTC medications have not helped. Pt denies pain/numbness to groin. Reports pain begins in upper thigh and shoots to her knee. Pt scheduled for OV tomorrow. This RN educated pt on new-worsening symptoms, when to call back/seek emergent care/ Pt verbalized understanding and agrees to plan.   Copied from CRM [id number]. Topic: Clinical - Pink Word Triage >> May 15, 2023  8:36 AM Burnard DEL wrote: Reason for Triage: on and off leg pain,tingling,numbness at top of leg Reason for Disposition  [1] MODERATE pain (e.g., interferes with normal activities, limping) AND [2] present > 3 days  Answer Assessment - Initial Assessment Questions 1. ONSET: When did the pain start?      6-7 months ago, worsening over time 2. LOCATION: Where is the pain located?      Left upper leg 3. PAIN: How bad is the pain?    (Scale 1-10; or mild, moderate, severe)   -  MILD (1-3): doesn't interfere with normal activities    -  MODERATE (4-7): interferes with normal activities (e.g., work or school) or awakens from sleep, limping    -  SEVERE (8-10): excruciating pain, unable to do any normal activities, unable to walk     Pt states 15, it's really bad 5. CAUSE: What do you think is causing the leg  pain?     Possibly nerve related 6. OTHER SYMPTOMS: Do you have any other symptoms? (e.g., chest pain, back pain, breathing difficulty, swelling, rash, fever, numbness, weakness)     Began as itching, numbness, stabbing pain  Protocols used: Leg Pain-A-AH

## 2023-05-16 ENCOUNTER — Ambulatory Visit (INDEPENDENT_AMBULATORY_CARE_PROVIDER_SITE_OTHER): Payer: Medicare Other | Admitting: Family Medicine

## 2023-05-16 ENCOUNTER — Encounter: Payer: Self-pay | Admitting: Family Medicine

## 2023-05-16 ENCOUNTER — Ambulatory Visit (INDEPENDENT_AMBULATORY_CARE_PROVIDER_SITE_OTHER): Payer: Medicare Other

## 2023-05-16 VITALS — BP 152/90 | HR 80 | Temp 98.5°F | Ht 62.0 in | Wt 117.9 lb

## 2023-05-16 DIAGNOSIS — R200 Anesthesia of skin: Secondary | ICD-10-CM | POA: Diagnosis not present

## 2023-05-16 DIAGNOSIS — M419 Scoliosis, unspecified: Secondary | ICD-10-CM | POA: Diagnosis not present

## 2023-05-16 DIAGNOSIS — G43809 Other migraine, not intractable, without status migrainosus: Secondary | ICD-10-CM

## 2023-05-16 DIAGNOSIS — R202 Paresthesia of skin: Secondary | ICD-10-CM | POA: Diagnosis not present

## 2023-05-16 DIAGNOSIS — M47816 Spondylosis without myelopathy or radiculopathy, lumbar region: Secondary | ICD-10-CM | POA: Diagnosis not present

## 2023-05-16 LAB — COMPREHENSIVE METABOLIC PANEL
ALT: 16 U/L (ref 0–35)
AST: 24 U/L (ref 0–37)
Albumin: 4.5 g/dL (ref 3.5–5.2)
Alkaline Phosphatase: 72 U/L (ref 39–117)
BUN: 17 mg/dL (ref 6–23)
CO2: 27 meq/L (ref 19–32)
Calcium: 10.1 mg/dL (ref 8.4–10.5)
Chloride: 101 meq/L (ref 96–112)
Creatinine, Ser: 0.72 mg/dL (ref 0.40–1.20)
GFR: 80.13 mL/min (ref >60.00)
Glucose, Bld: 114 mg/dL — ABNORMAL HIGH (ref 70–99)
Potassium: 3.9 meq/L (ref 3.5–5.1)
Sodium: 139 meq/L (ref 135–145)
Total Bilirubin: 0.5 mg/dL (ref 0.2–1.2)
Total Protein: 7.4 g/dL (ref 6.0–8.3)

## 2023-05-16 LAB — CK: Total CK: 45 U/L (ref 7–177)

## 2023-05-16 LAB — VITAMIN B12: Vitamin B-12: 550 pg/mL (ref 211–911)

## 2023-05-16 MED ORDER — PROPRANOLOL HCL ER 60 MG PO CP24: 60.0000 mg | ORAL_CAPSULE | Freq: Every day | ORAL | 1 refills | Status: DC

## 2023-05-16 NOTE — Patient Instructions (Addendum)
 Diclofenac cream -- 1% gel -- apply up to 4 times a day if needed to the area.   Capsacin cream-- will also help numb the area.  Icy hot, Biofreeze, etc.

## 2023-05-16 NOTE — Progress Notes (Signed)
 Acute Office Visit  Subjective:     Patient ID: Rebecca Davidson, female    DOB: [birth year], 78 y.o.   MRN: [id number]  Chief Complaint  Patient presents with   Numbness    Patient complains of left lateral thigh to the knee numbness x6 months, severe lately, no known injury    HPI Patient is in today for left lateral thigh symptoms. It started as an itching sensation at night, then the next 3 months it turned to numbness, now it is spreading down the outside of her left thigh. States she is having symptoms during the day now, also accompanied by stabbing pain. No rashes or lesions on the skin that she noticed. She reports no acute back pain or other issues there, there is no swelling in the leg, there is not pain with bearing weight. States that she had this happen to her in the past in the left shoulder, states that this pain is similar to her previous episode. She deneis any rashes on the skin that she had seen.   Review of Systems  All other systems reviewed and are negative.       Objective:    BP (!) 152/90   Pulse 80   Temp 98.5 F (36.9 C) (Oral)   Ht 5' 2 (1.575 m)   Wt 117 lb 14.4 oz (53.5 kg)   SpO2 96%   BMI 21.56 kg/m    Physical Exam Vitals reviewed.  Constitutional:      Appearance: Normal appearance. She is normal weight.  Cardiovascular:     Rate and Rhythm: Normal rate and regular rhythm.     Pulses: Normal pulses.  Musculoskeletal:        General: Tenderness (tenderness to palpation of the left later thigh) present. No swelling.     Right lower leg: No edema.     Left lower leg: No edema.  Skin:    Findings: No erythema or rash.  Neurological:     Mental Status: She is alert and oriented to person, place, and time. Mental status is at baseline.  Psychiatric:        Mood and Affect: Mood normal.        Behavior: Behavior normal.     Results for orders placed or performed in visit on 05/16/23  CK  Result Value Ref Range   Total CK 45 7 - 177  U/L  Vitamin B12  Result Value Ref Range   Vitamin B-12 550 211 - 911 pg/mL  CMP  Result Value Ref Range   Sodium 139 135 - 145 mEq/L   Potassium 3.9 3.5 - 5.1 mEq/L   Chloride 101 96 - 112 mEq/L   CO2 27 19 - 32 mEq/L   Glucose, Bld 114 (H) 70 - 99 mg/dL   BUN 17 6 - 23 mg/dL   Creatinine, Ser 9.27 0.40 - 1.20 mg/dL   Total Bilirubin 0.5 0.2 - 1.2 mg/dL   Alkaline Phosphatase 72 39 - 117 U/L   AST 24 0 - 37 U/L   ALT 16 0 - 35 U/L   Total Protein 7.4 6.0 - 8.3 g/dL   Albumin 4.5 3.5 - 5.2 g/dL   GFR 19.86 >39.99 mL/min   Calcium  10.1 8.4 - 10.5 mg/dL        Assessment & Plan:   Problem List Items Addressed This Visit       Unprioritized   Vestibular migraine (Chronic)   Refilled pt's propranolol  today, will  re-evaluate this problem next visit.       Relevant Medications   propranolol  ER (INDERAL  LA) 60 MG 24 hr capsule   Other Visit Diagnoses       Left leg paresthesias    -  Primary   Relevant Orders   CK (Completed)   Vitamin B12 (Completed)   CMP (Completed)   DG Lumbar Spine Complete (Completed)   Ambulatory referral to Neurology     Unclear etiology, could be a sign of lumbar radiculopathy. I will check CK, B12, CMP and x-ray of the lumbar spine. Will send her to neurology for EMG of the lower extremities.   Meds ordered this encounter  Medications   propranolol  ER (INDERAL  LA) 60 MG 24 hr capsule    Sig: Take 1 capsule (60 mg total) by mouth daily.    Dispense:  90 capsule    Refill:  1    Last refill-patient needs an appt since last visit was over 1 yr ago    No follow-ups on file.  Heron CHRISTELLA Sharper, MD

## 2023-05-17 NOTE — Assessment & Plan Note (Signed)
 Refilled pt's propranolol  today, will re-evaluate this problem next visit.

## 2023-05-18 ENCOUNTER — Ambulatory Visit: Payer: Medicare Other | Admitting: Neurology

## 2023-05-18 ENCOUNTER — Encounter: Payer: Self-pay | Admitting: Neurology

## 2023-05-18 VITALS — BP 151/91 | HR 86 | Resp 16 | Ht 62.0 in

## 2023-05-18 DIAGNOSIS — Z0389 Encounter for observation for other suspected diseases and conditions ruled out: Secondary | ICD-10-CM | POA: Diagnosis not present

## 2023-05-18 DIAGNOSIS — G5712 Meralgia paresthetica, left lower limb: Secondary | ICD-10-CM | POA: Diagnosis not present

## 2023-05-18 DIAGNOSIS — R7309 Other abnormal glucose: Secondary | ICD-10-CM | POA: Diagnosis not present

## 2023-05-18 DIAGNOSIS — Z1329 Encounter for screening for other suspected endocrine disorder: Secondary | ICD-10-CM | POA: Diagnosis not present

## 2023-05-18 DIAGNOSIS — E079 Disorder of thyroid, unspecified: Secondary | ICD-10-CM | POA: Diagnosis not present

## 2023-05-18 DIAGNOSIS — R7989 Other specified abnormal findings of blood chemistry: Secondary | ICD-10-CM | POA: Diagnosis not present

## 2023-05-18 MED ORDER — DICLOFENAC SODIUM 3 % EX GEL: CUTANEOUS | 6 refills | Status: DC

## 2023-05-18 MED ORDER — NORTRIPTYLINE HCL 10 MG PO CAPS: 20.0000 mg | ORAL_CAPSULE | Freq: Every day | ORAL | 11 refills | Status: DC

## 2023-05-18 MED ORDER — LIDOCAINE-PRILOCAINE 2.5-2.5 % EX CREA
TOPICAL_CREAM | CUTANEOUS | 11 refills | Status: AC
Start: 2023-05-18 — End: ?

## 2023-05-18 NOTE — Patient Instructions (Addendum)
Meds ordered this encounter  Medications   nortriptyline (PAMELOR) 10 MG capsule    Sig: Take 2 capsules (20 mg total) by mouth at bedtime.    Dispense:  60 capsule    Refill:  11   lidocaine-prilocaine (EMLA) cream    Sig: 1 gram qid as needed.    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

## 2023-05-18 NOTE — Progress Notes (Signed)
Chief Complaint  Patient presents with   New Patient (Initial Visit)    Rm14, alone,  internal referral for Left leg paresthesias:ongoing greater than 6 months primarily at night, possible EMG      ASSESSMENT AND PLAN  [name] is a 78 y.o. female   Left lateral femoral cutaneous neuropathy Neuropathic pain  She could not tolerate gabapentin we will try low-dose nortriptyline 10 mg titrating to 20 mg every night  Diclofenac gel may mixed with EMLA gel as needed, warm compression,  If she remains symptomatic, she will contact our office for further evaluation  Laboratory evaluations including thyroid functional test, inflammatory markers   DIAGNOSTIC DATA (LABS, IMAGING, TESTING) - I reviewed patient records, labs, notes, testing and imaging myself where available.   MEDICAL HISTORY:  Rebecca Davidson, is a 78 year old female seen in request by her primary care Nira Conn for evaluation of left lateral thigh numbness, initial evaluation was on May 18, 2023  History is obtained from the patient and review of electronic medical records. I personally reviewed pertinent available imaging films in PACS.   PMHx of  Chronic migraine. UTI HTN  About 6 months ago, without clear triggers, she began to noticed itching tingling sensation at the left lateral thigh area, then evolved into numbness, also involving bigger area, since October 2024, she began to notice intermittent stabbing pain in the area, this reminded her of the nerve pain she suffered when she injured her left shoulder few years back  The left side stabbing pain is worse at nighttime, almost nightly, difficulty falling to sleep, she denied gait abnormality, denies lower extremity weakness,  She had a history of L2 compression fracture in the past, but denies recurrent low back pain, denies radiating pain    PHYSICAL EXAM:   Vitals:   05/18/23 1052  BP: (!) 151/91  Pulse: 86  Resp: 16  Height: 5\' 2"   (1.575 m)   Not recorded     Body mass index is 21.56 kg/m.  PHYSICAL EXAMNIATION:  Gen: NAD, conversant, well nourised, well groomed                     Cardiovascular: Regular rate rhythm, no peripheral edema, warm, nontender. Eyes: Conjunctivae clear without exudates or hemorrhage Neck: Supple, no carotid bruits. Pulmonary: Clear to auscultation bilaterally   NEUROLOGICAL EXAM:  MENTAL STATUS: Speech/cognition: Awake, alert, oriented to history taking and casual conversation CRANIAL NERVES: CN II: Visual fields are full to confrontation. Pupils are round equal and briskly reactive to light. CN III, IV, VI: extraocular movement are normal. No ptosis. CN V: Facial sensation is intact to light touch CN VII: Face is symmetric with normal eye closure  CN VIII: Hearing is normal to causal conversation. CN IX, X: Phonation is normal. CN XI: Head turning and shoulder shrug are intact  MOTOR: There is no pronator drift of out-stretched arms. Muscle bulk and tone are normal. Muscle strength is normal.  REFLEXES: Reflexes are 2+ and symmetric at the biceps, triceps, absent at knees, and ankles. Plantar responses are flexor.  SENSORY: Intact to light touch, pinprick and vibratory sensation are intact in fingers and toes, with exception of decreased light touch pinprick at the left lateral thigh and,  COORDINATION: There is no trunk or limb dysmetria noted.  GAIT/STANCE: Posture is normal. Gait is steady with normal steps, base, arm swing, and turning. Heel and toe walking are normal. Tandem gait is normal.  Romberg  is absent.  REVIEW OF SYSTEMS:  Full 14 system review of systems performed and notable only for as above All other review of systems were negative.   ALLERGIES: Allergies  Allergen Reactions   Ciprofloxacin     Made her sick    Macrobid [Nitrofurantoin]     Makes her sick   Myrbetriq [Mirabegron] Nausea Only   Doxycycline Rash   Sulfa Antibiotics  Swelling and Rash    HOME MEDICATIONS: Current Outpatient Medications  Medication Sig Dispense Refill   CALCIUM PO Take 1,400 mg by mouth daily.     cefdinir (OMNICEF) 300 MG capsule Take 300 mg by mouth 2 (two) times daily.     cholecalciferol (VITAMIN D3) 25 MCG (1000 UT) tablet Take 1,000 Units by mouth daily.     Magnesium 250 MG TABS Take 250 mg by mouth daily.     melatonin 5 MG TABS Take 5 mg by mouth at bedtime as needed.     Multiple Vitamins-Minerals (CENTRUM ADULTS PO) Take 1 tablet by mouth daily.     propranolol ER (INDERAL LA) 60 MG 24 hr capsule Take 1 capsule (60 mg total) by mouth daily. 90 capsule 1   SUMAtriptan (IMITREX) 100 MG tablet May repeat in 2 hours if headache persists or recurs. 10 tablet 5   No current facility-administered medications for this visit.    PAST MEDICAL HISTORY: Past Medical History:  Diagnosis Date   Chronic headaches    Hypercholesteremia    Hypertension    Kidney stone    Nephrolithiasis    Thyroid disease     PAST SURGICAL HISTORY: Past Surgical History:  Procedure Laterality Date   APPENDECTOMY N/A 07/10/1998   BREAST BIOPSY Right    oophorectomy Bilateral    ovaries and tubes   TUBAL LIGATION      FAMILY HISTORY: Family History  Problem Relation Age of Onset   Alzheimer's disease Mother    Diabetes Mother    Aneurysm Father    Hypertension Sister    Hyperlipidemia Sister    Breast cancer Sister    Diabetes type II Sister    Heart disease Maternal Grandfather    Crohn's disease Daughter    Celiac disease Son    Colon cancer Neg Hx    Esophageal cancer Neg Hx    Stomach cancer Neg Hx     SOCIAL HISTORY: Social History   Socioeconomic History   Marital status: Divorced    Spouse name: Not on file   Number of children: 5   Years of education: Not on file   Highest education level: 12th grade  Occupational History   Occupation: retired  Tobacco Use   Smoking status: Never   Smokeless tobacco: Never   Vaping Use   Vaping status: Never Used  Substance and Sexual Activity   Alcohol use: Not Currently   Drug use: Not on file   Sexual activity: Not on file  Other Topics Concern   Not on file  Social History Narrative   Not on file   Social Drivers of Health   Financial Resource Strain: Low Risk  (05/15/2023)   Overall Financial Resource Strain (CARDIA)    Difficulty of Paying Living Expenses: Not hard at all  Food Insecurity: No Food Insecurity (05/15/2023)   Hunger Vital Sign    Worried About Running Out of Food in the Last Year: Never true    Ran Out of Food in the Last Year: Never true  Transportation Needs: No  Transportation Needs (05/15/2023)   PRAPARE - Administrator, Civil Service (Medical): No    Lack of Transportation (Non-Medical): No  Physical Activity: Insufficiently Active (05/15/2023)   Exercise Vital Sign    Days of Exercise per Week: 4 days    Minutes of Exercise per Session: 30 min  Stress: No Stress Concern Present (05/15/2023)   [employer] of Occupational Health - Occupational Stress Questionnaire    Feeling of Stress : Not at all  Social Connections: Socially Isolated (05/15/2023)   Social Connection and Isolation Panel [NHANES]    Frequency of Communication with Friends and Family: More than three times a week    Frequency of Social Gatherings with Friends and Family: Twice a week    Attends Religious Services: Never    Database administrator or Organizations: No    Attends Engineer, structural: Not on file    Marital Status: Divorced  Intimate Partner Violence: Not on file      Levert Feinstein, M.D. Ph.D.  [facility] [address] [place], [place] [address] Ph: [phone] Fax: [phone]  CC:  Karie Georges, MD [address] [place],  [place] [address]  Karie Georges, MD

## 2023-05-19 LAB — HGB A1C W/O EAG: Hgb A1c MFr Bld: 5.8 % — ABNORMAL HIGH (ref 4.8–5.6)

## 2023-05-19 LAB — ANA W/REFLEX IF POSITIVE: Anti Nuclear Antibody (ANA): NEGATIVE

## 2023-05-19 LAB — C-REACTIVE PROTEIN: CRP: <1 mg/L (ref 0–10)

## 2023-05-19 LAB — SEDIMENTATION RATE: Sed Rate: 4 mm/h (ref 0–40)

## 2023-05-19 LAB — TSH: TSH: 0.8 u[IU]/mL (ref 0.450–4.500)

## 2023-05-22 ENCOUNTER — Encounter: Payer: Self-pay | Admitting: Family Medicine

## 2023-05-22 ENCOUNTER — Encounter: Payer: Self-pay | Admitting: Neurology

## 2023-05-22 NOTE — Progress Notes (Signed)
ACUTE VISIT No chief complaint on file.  HPI: Rebecca Davidson is a 78 y.o. female with a PMHx significant for HTN, vestibular migraine, multiple thyroid nodules, nephrolithiasis, and vitamin D deficiency, who is here today complaining of recurrent UTI symptoms.   Review of Systems See other pertinent positives and negatives in HPI.  Current Outpatient Medications on File Prior to Visit  Medication Sig Dispense Refill   CALCIUM PO Take 1,400 mg by mouth daily.     cefdinir (OMNICEF) 300 MG capsule Take 300 mg by mouth 2 (two) times daily.     cholecalciferol (VITAMIN D3) 25 MCG (1000 UT) tablet Take 1,000 Units by mouth daily.     Diclofenac Sodium 3 % GEL 1 gram qid 100 g 6   lidocaine-prilocaine (EMLA) cream 1 gram qid as needed. 30 g 11   Magnesium 250 MG TABS Take 250 mg by mouth daily.     melatonin 5 MG TABS Take 5 mg by mouth at bedtime as needed.     Multiple Vitamins-Minerals (CENTRUM ADULTS PO) Take 1 tablet by mouth daily.     nortriptyline (PAMELOR) 10 MG capsule Take 2 capsules (20 mg total) by mouth at bedtime. 60 capsule 11   propranolol ER (INDERAL LA) 60 MG 24 hr capsule Take 1 capsule (60 mg total) by mouth daily. 90 capsule 1   SUMAtriptan (IMITREX) 100 MG tablet May repeat in 2 hours if headache persists or recurs. 10 tablet 5   No current facility-administered medications on file prior to visit.    Past Medical History:  Diagnosis Date   Chronic headaches    Hypercholesteremia    Hypertension    Kidney stone    Nephrolithiasis    Thyroid disease    Allergies  Allergen Reactions   Ciprofloxacin     Made her sick    Macrobid [Nitrofurantoin]     Makes her sick   Myrbetriq [Mirabegron] Nausea Only   Doxycycline Rash   Sulfa Antibiotics Swelling and Rash    Social History   Socioeconomic History   Marital status: Divorced    Spouse name: Not on file   Number of children: 5   Years of education: Not on file   Highest education level: 12th grade   Occupational History   Occupation: retired  Tobacco Use   Smoking status: Never   Smokeless tobacco: Never  Vaping Use   Vaping status: Never Used  Substance and Sexual Activity   Alcohol use: Not Currently   Drug use: Not on file   Sexual activity: Not on file  Other Topics Concern   Not on file  Social History Narrative   Not on file   Social Drivers of Health   Financial Resource Strain: Low Risk  (05/15/2023)   Overall Financial Resource Strain (CARDIA)    Difficulty of Paying Living Expenses: Not hard at all  Food Insecurity: No Food Insecurity (05/15/2023)   Hunger Vital Sign    Worried About Running Out of Food in the Last Year: Never true    Ran Out of Food in the Last Year: Never true  Transportation Needs: No Transportation Needs (05/15/2023)   PRAPARE - Administrator, Civil Service (Medical): No    Lack of Transportation (Non-Medical): No  Physical Activity: Insufficiently Active (05/15/2023)   Exercise Vital Sign    Days of Exercise per Week: 4 days    Minutes of Exercise per Session: 30 min  Stress: No Stress Concern Present (  05/15/2023)   [employer] [employer] of [employer]ational [employer] - Occupational Stress Questionnaire    Feeling of Stress : Not at all  Social Connections: Socially Isolated (05/15/2023)   Social Connection and Isolation Panel [NHANES]    Frequency of Communication with Friends and Family: More than three times a week    Frequency of Social Gatherings with Friends and Family: Twice a week    Attends Religious Services: Never    Database administrator or Organizations: No    Attends Engineer, structural: Not on file    Marital Status: Divorced    There were no vitals filed for this visit. There is no height or weight on file to calculate BMI.  Physical Exam  ASSESSMENT AND PLAN:  Ms. Rickels was seen today for recurrent UTI symptoms.   There are no diagnoses linked to this encounter.  No follow-ups on file.  I, Rolla Etienne Wierda, acting as a scribe for Dillyn Menna [place], Rebecca Davidson., have documented all relevant documentation on the behalf of Rebecca Mangels [place], Rebecca Davidson, as directed by  Rebecca Scurlock [place], Rebecca Davidson while in the presence of Rebecca Rosenburg [place], Rebecca Davidson.   I, Avaree Gilberti [place], Rebecca Davidson, have reviewed all documentation for this visit. The documentation on 05/22/23 for the exam, diagnosis, procedures, and orders are all accurate and complete.  Rebecca Mattioli G. [place], Rebecca Davidson  [facility] [place] [facility].  Discharge Instructions   None

## 2023-05-23 ENCOUNTER — Ambulatory Visit: Payer: Medicare Other | Admitting: Family Medicine

## 2023-05-23 ENCOUNTER — Encounter: Payer: Self-pay | Admitting: Family Medicine

## 2023-05-23 VITALS — BP 126/80 | HR 82 | Temp 98.4°F | Resp 16 | Ht 62.0 in | Wt 119.0 lb

## 2023-05-23 DIAGNOSIS — R350 Frequency of micturition: Secondary | ICD-10-CM

## 2023-05-23 LAB — POC URINALSYSI DIPSTICK (AUTOMATED)
Bilirubin, UA: NEGATIVE
Blood, UA: NEGATIVE
Glucose, UA: NEGATIVE
Ketones, UA: NEGATIVE
Nitrite, UA: NEGATIVE
Protein, UA: POSITIVE — AB
Spec Grav, UA: 1.015 (ref 1.010–1.025)
Urobilinogen, UA: 0.2 U/dL
pH, UA: 6 (ref 5.0–8.0)

## 2023-05-23 NOTE — Patient Instructions (Addendum)
A few things to remember from today's visit:  Urinary frequency - Plan: POCT Urinalysis Dipstick (Automated), Culture, Urine  For now I recommend following urine culture and treat if it is still a UTI. Continue adequate hydration. Monitor for new symptoms.  Do not use My Chart to request refills or for acute issues that need immediate attention. If you send a my chart message, it may take a few days to be addressed, specially if I am not in the office.  Please be sure medication list is accurate. If a new problem present, please set up appointment sooner than planned today.

## 2023-05-25 LAB — URINE CULTURE
MICRO NUMBER:: 15981749
SPECIMEN QUALITY:: ADEQUATE

## 2023-05-26 ENCOUNTER — Encounter: Payer: Self-pay | Admitting: Family Medicine

## 2023-05-26 ENCOUNTER — Ambulatory Visit: Payer: Self-pay | Admitting: Family Medicine

## 2023-05-26 ENCOUNTER — Other Ambulatory Visit: Payer: Self-pay

## 2023-05-26 MED ORDER — CEPHALEXIN 500 MG PO CAPS: 500.0000 mg | ORAL_CAPSULE | Freq: Two times a day (BID) | ORAL | 0 refills | Status: AC

## 2023-05-26 NOTE — Telephone Encounter (Signed)
Dr. [place] already called in another antibiotics for her

## 2023-05-26 NOTE — Telephone Encounter (Signed)
Copied from CRM [id number]. Topic: Clinical - Pink Word Triage >> May 26, 2023  7:45 AM Kathryne Eriksson wrote: Patient symptoms frequent urination and pressure.    Chief Complaint: Continuing symptoms from UTI Symptoms: Frequency Frequency: 16 days (since January 12th) Pertinent Negatives: Patient denies burning with urination, fever, chills, blood in urine Disposition: [] ED /[] Urgent Care (no appt availability in office) / [] Appointment(In office/virtual)/ []  [facility]/ [] Home Care/ [] Refused Recommended Disposition /[] [facility]/ []  Follow-up with PCP Additional Notes: Patient advised that she went to an urgent care two weeks ago, had antibiotics, and she saw Dr [place] 3 days ago and patient saw her results in MyChart stating that she had E Coli.  She states that she has been drinking plenty of water.  She is still having symptoms and thinks that she may need more antibiotics.  Patient states that she believes the first round of antibiotics might not have been strong enough.  She denies any fevers, blood in her urine, pain with urination, chills, nausea, vomiting, diarrhea.  Patient states she wanted to hear back from the PCP office on if another antibiotic is needed or what her next step should be.  She states that she just came in 3 days ago and got her urine culture results back yesterday so she didn't want to make another appointment at this time unless the provider does want to see her again.  She is advised that if she gets worse she can go to the emergency room.  Patient verbalized understanding.  Reason for Disposition  Urinating more frequently than usual (i.e., frequency)  Answer Assessment - Initial Assessment Questions 1. SYMPTOM: "What's the main symptom you're concerned about?" (e.g., frequency, incontinence)     Frequency 2. ONSET: "When did the  frequency  start?"     frequency 3. PAIN: "Is there any pain?" If Yes, ask: "How bad is it?" (Scale: 1-10; mild,  moderate, severe)     No 4. CAUSE: "What do you think is causing the symptoms?"     UTI/E Coli persistent 5. OTHER SYMPTOMS: "Do you have any other symptoms?" (e.g., blood in urine, fever, flank pain, pain with urination)     No  Protocols used: Urinary Symptoms-A-AH

## 2023-05-26 NOTE — Telephone Encounter (Signed)
See Mychart message sent by PCP.

## 2023-06-05 ENCOUNTER — Encounter: Payer: Self-pay | Admitting: Family Medicine

## 2023-06-05 ENCOUNTER — Ambulatory Visit: Payer: Medicare Other | Admitting: Family Medicine

## 2023-06-05 VITALS — BP 110/74 | HR 76 | Temp 98.1°F | Ht 62.0 in | Wt 116.7 lb

## 2023-06-05 DIAGNOSIS — M810 Age-related osteoporosis without current pathological fracture: Secondary | ICD-10-CM

## 2023-06-05 DIAGNOSIS — Z13220 Encounter for screening for lipoid disorders: Secondary | ICD-10-CM | POA: Diagnosis not present

## 2023-06-05 DIAGNOSIS — Z0000 Encounter for general adult medical examination without abnormal findings: Secondary | ICD-10-CM | POA: Diagnosis not present

## 2023-06-05 LAB — LIPID PANEL
Cholesterol: 223 mg/dL — ABNORMAL HIGH (ref 0–200)
HDL: 88.7 mg/dL (ref >39.00)
LDL Cholesterol: 118 mg/dL — ABNORMAL HIGH (ref 0–99)
NonHDL: 134.13
Total CHOL/HDL Ratio: 3
Triglycerides: 81 mg/dL (ref 0.0–149.0)
VLDL: 16.2 mg/dL (ref 0.0–40.0)

## 2023-06-05 MED ORDER — ALENDRONATE SODIUM 70 MG PO TABS: 70.0000 mg | ORAL_TABLET | ORAL | 3 refills | Status: DC

## 2023-06-05 NOTE — Progress Notes (Signed)
Complete physical exam  Patient: Rebecca Davidson   DOB: 1946-01-07   78 y.o. Female  MRN: [id number]  Subjective:    Chief Complaint  Patient presents with   Medicare Wellness    Rebecca Davidson is a 78 y.o. female who presents today for a complete physical exam. She reports consuming a general diet. Exercise is limited by neurologic condition(s): neuropathy in the left leg- continued pain. She generally feels well. She reports sleeping well. She does not have additional problems to discuss today.    Most recent fall risk assessment:    06/05/2023    9:25 AM  Fall Risk   Falls in the past year? 1  Number falls in past yr: 1  Injury with Fall? 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   cholecalciferol (VITAMIN D3) 25 MCG (1000 UT) tablet Take 1,000 Units by mouth daily.   Diclofenac Sodium 3 % GEL 1 gram qid   lidocaine-prilocaine (EMLA) cream 1 gram qid as needed.   Magnesium 250 MG TABS Take 250 mg by mouth daily.   melatonin 5 MG TABS Take 5 mg by mouth at bedtime as needed.   Multiple Vitamins-Minerals  (CENTRUM ADULTS PO) Take 1 tablet by mouth daily.   nortriptyline (PAMELOR) 10 MG capsule Take 2 capsules (20 mg total) by mouth at bedtime.   propranolol ER (INDERAL LA) 60 MG 24 hr capsule Take 1 capsule (60 mg total) by mouth daily.   SUMAtriptan (IMITREX) 100 MG tablet May repeat in 2 hours if headache persists or recurs.   No facility-administered medications prior to visit.    Review of Systems  HENT:  Negative for hearing loss.   Eyes:  Negative for blurred vision.  Respiratory:  Negative for shortness of breath.   Cardiovascular:  Negative for chest pain.  Gastrointestinal: Negative.   Genitourinary: Negative.   Musculoskeletal:  Negative for back pain.  Neurological:  Negative for headaches.  Psychiatric/Behavioral:  Negative for depression.        Objective:     BP 110/74   Pulse 76   Temp 98.1 F (36.7 C) (Oral)   Ht 5\' 2"  (1.575 m)   Wt 116 lb 11.2 oz (52.9 kg)   SpO2 98%   BMI 21.34 kg/m    Physical Exam Vitals reviewed.  Constitutional:      Appearance: Normal appearance. She is well-groomed and normal weight.  HENT:     Right Ear: Tympanic membrane and ear canal normal.  Left Ear: Tympanic membrane and ear canal normal.     Mouth/Throat:     Mouth: Mucous membranes are moist.     Pharynx: No posterior oropharyngeal erythema.  Eyes:     Conjunctiva/sclera: Conjunctivae normal.  Neck:     Thyroid: No thyromegaly.  Cardiovascular:     Rate and Rhythm: Normal rate and regular rhythm.     Pulses: Normal pulses.     Heart sounds: S1 normal and S2 normal.  Pulmonary:     Effort: Pulmonary effort is normal.     Breath sounds: Normal breath sounds and air entry.  Abdominal:     General: Abdomen is flat. Bowel sounds are normal.     Palpations: Abdomen is soft.  Musculoskeletal:     Right lower leg: No edema.     Left lower leg: No edema.  Lymphadenopathy:     Cervical: No cervical adenopathy.  Neurological:     Mental Status: She is alert and  oriented to person, place, and time. Mental status is at baseline.     Gait: Gait is intact.  Psychiatric:        Mood and Affect: Mood and affect normal.        Speech: Speech normal.        Behavior: Behavior normal.        Judgment: Judgment normal.      No results found for any visits on 06/05/23.     Assessment & Plan:    Routine Health Maintenance and Physical Exam  Immunization History  Administered Date(s) Administered   Fluad Quad(high Dose 65+) 01/25/2022   Influenza, High Dose Seasonal PF 02/25/2023   Influenza-Unspecified 02/01/2020   Moderna Covid-19 Fall Seasonal Vaccine 71yrs & older 07/19/2022   PFIZER(Purple Top)SARS-COV-2 Vaccination 06/08/2019, 07/03/2019, 01/01/2020, 08/19/2020   RSV,unspecified 04/04/2022   Tdap 08/24/2012   Zoster Recombinant(Shingrix) 03/02/2020, 05/02/2020    Health Maintenance  Topic Date Due   Medicare Annual Wellness (AWV)  Never done   Pneumonia Vaccine 12+ Years old (1 of 1 - PCV) Never done   DTaP/Tdap/Td (2 - Td or Tdap) 08/25/2022   COVID-19 Vaccine (6 - 2024-25 season) 06/21/2023 (Originally 01/01/2023)   Hepatitis C Screening  06/04/2024 (Originally 01/11/1964)   INFLUENZA VACCINE  Completed   DEXA SCAN  Completed   Zoster Vaccines- Shingrix  Completed   HPV VACCINES  Aged Out   Colonoscopy  Discontinued   Fecal DNA (Cologuard)  Discontinued    Discussed health benefits of physical activity, and encouraged her to engage in regular exercise appropriate for her age and condition.  Routine adult health maintenance  Age-related osteoporosis without current pathological fracture -     Alendronate Sodium; Take 1 tablet (70 mg total) by mouth every 7 (seven) days. Take with a full glass of water on an empty stomach.  Dispense: 12 tablet; Refill: 3  Lipid screening -     Lipid panel; Future   Normal physical exam findings today, she was counseled on increasing exercise once her leg pain has improved. We reviewed her DEXA  scan and I counseled her on treatments for osteoporosis. Handouts given on healthy eating and exercise, lipid panel ordered for surveillance.   Return in about 6 months (around 12/03/2023) for folloow up osteoporosis/ left leg neuralgia.     Karie Georges, MD

## 2023-06-05 NOTE — Patient Instructions (Signed)
Nizoral shampoo- can help reduce inflammation of scalp

## 2023-06-09 ENCOUNTER — Encounter: Payer: Self-pay | Admitting: Family Medicine

## 2023-06-10 ENCOUNTER — Other Ambulatory Visit: Payer: Self-pay | Admitting: Neurology

## 2023-06-12 ENCOUNTER — Encounter: Payer: Self-pay | Admitting: Family Medicine

## 2023-06-12 DIAGNOSIS — M810 Age-related osteoporosis without current pathological fracture: Secondary | ICD-10-CM

## 2023-06-12 IMAGING — MG MM DIGITAL SCREENING BILAT W/ TOMO AND CAD
8 series · 8 of 24 positions shown · non-contrast
Comparison: Previous exam(s).

CLINICAL DATA: Screening.

EXAM:
DIGITAL SCREENING BILATERAL MAMMOGRAM WITH TOMOSYNTHESIS AND CAD
TECHNIQUE: Bilateral screening digital craniocaudal and mediolateral oblique
mammograms were obtained. Bilateral screening digital breast
tomosynthesis was performed. The images were evaluated with
computer-aided detection.

[L CC synth-2D]
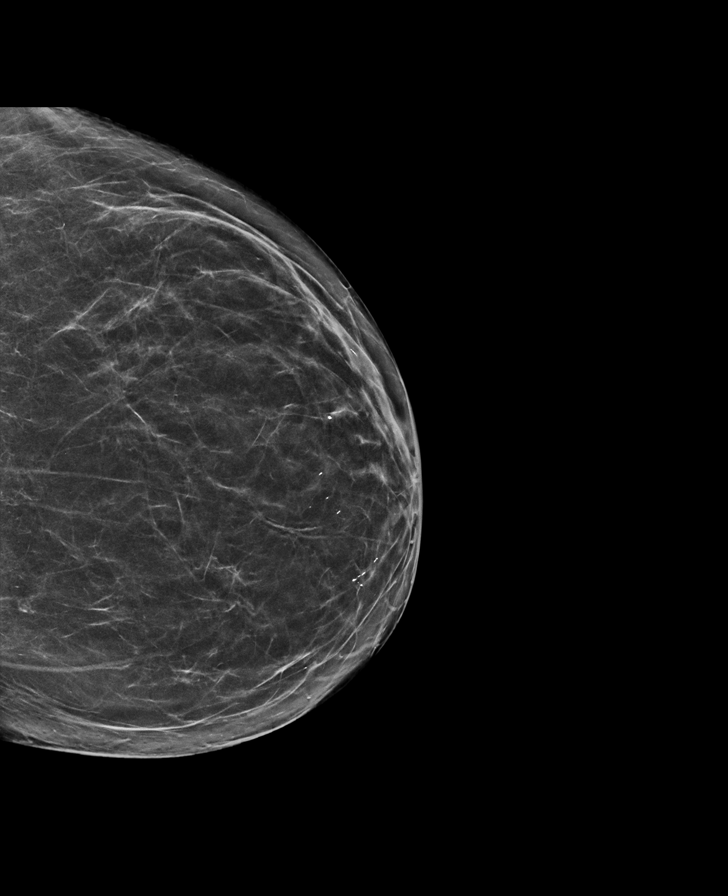

[L MLO synth-2D]
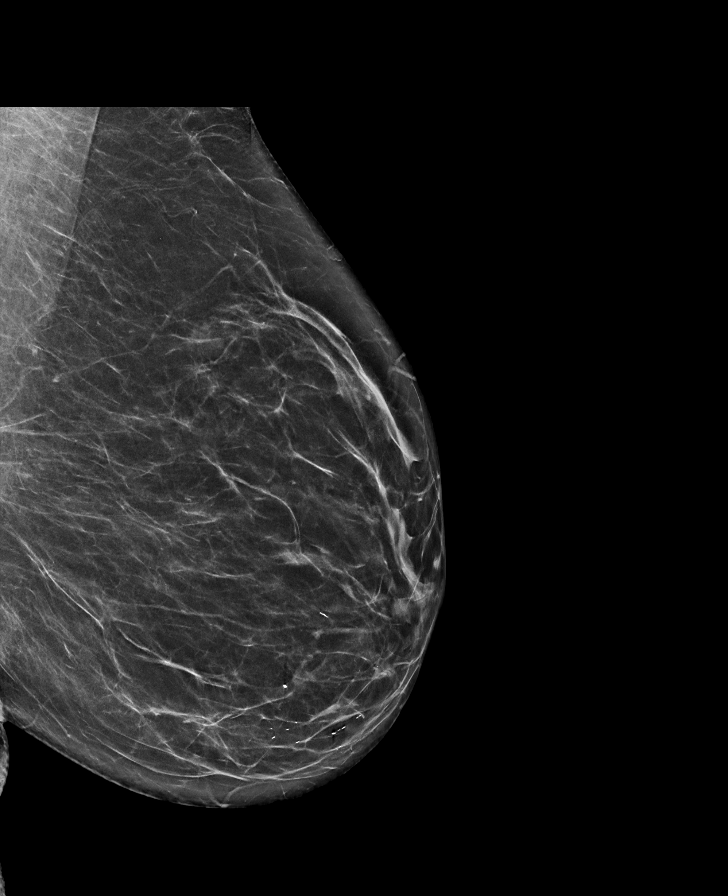

[R CC synth-2D]
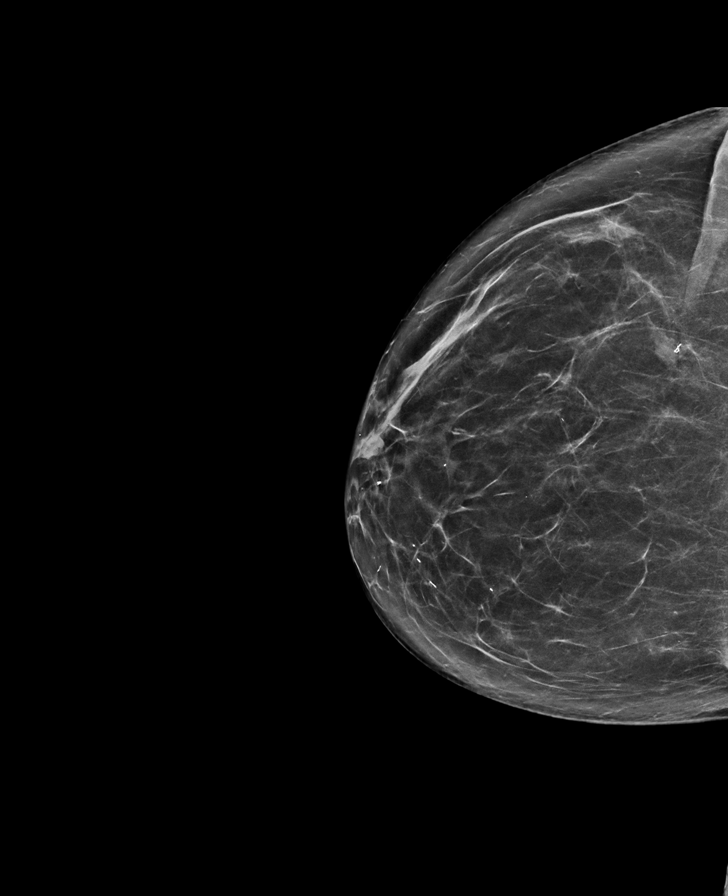

[R MLO synth-2D]
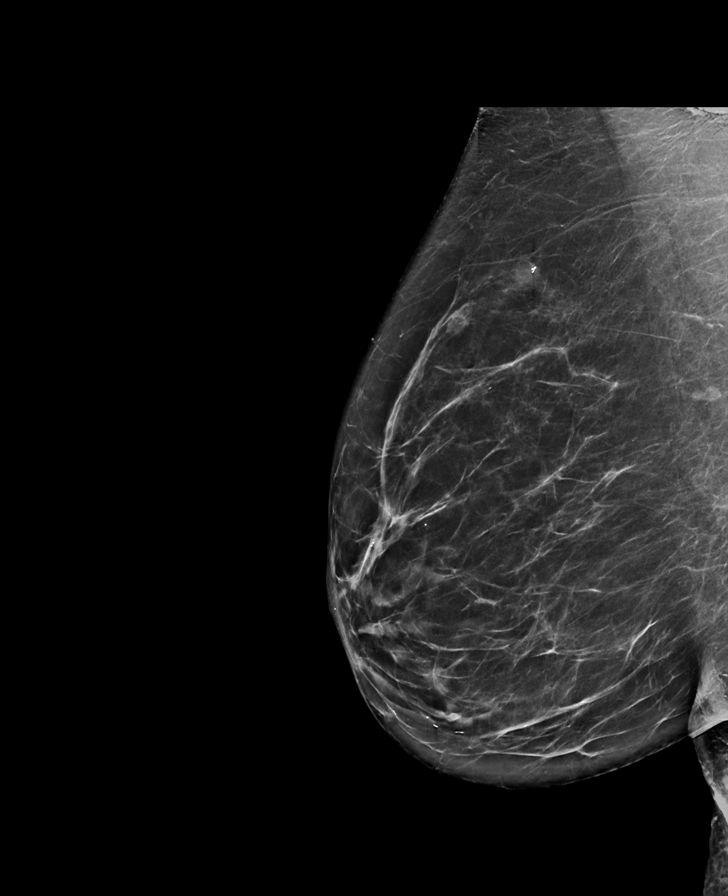

[L MLO tomo · tomo slice 35/69.0]
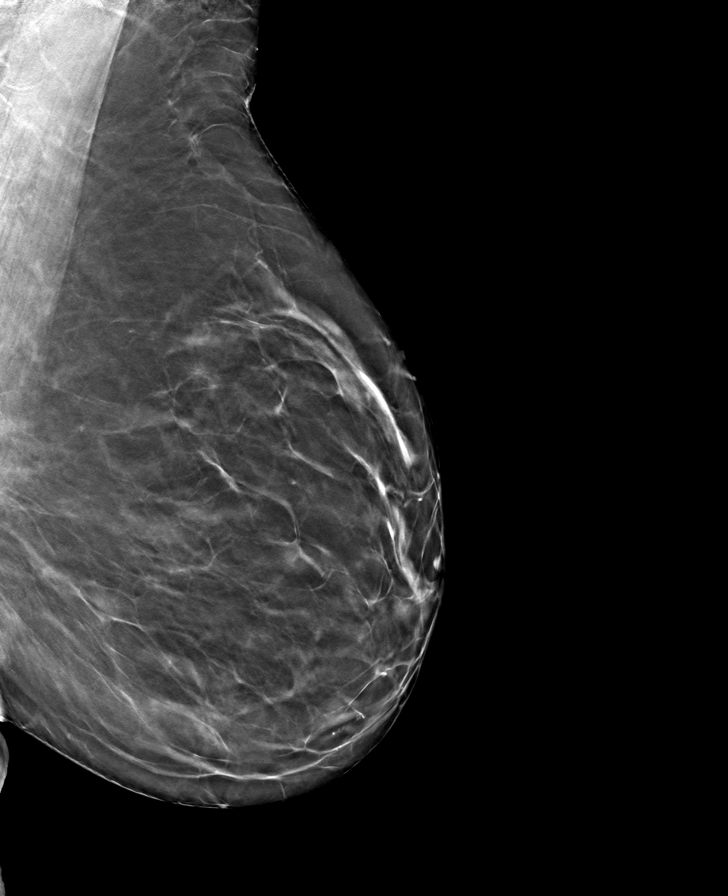

[R MLO tomo · tomo slice 37/74.0]
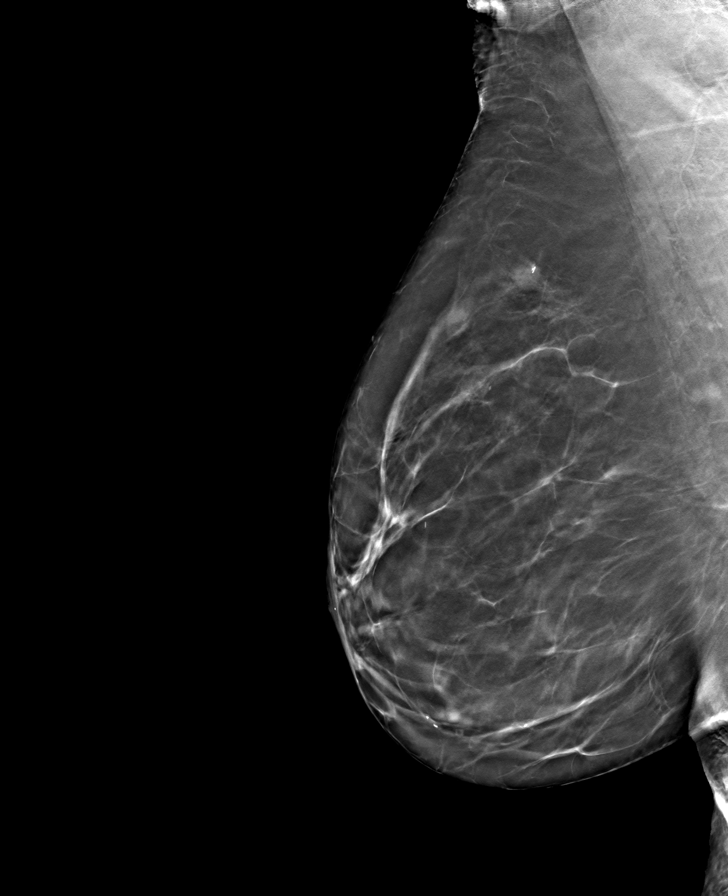

[R CC tomo · tomo slice 35/70.0]
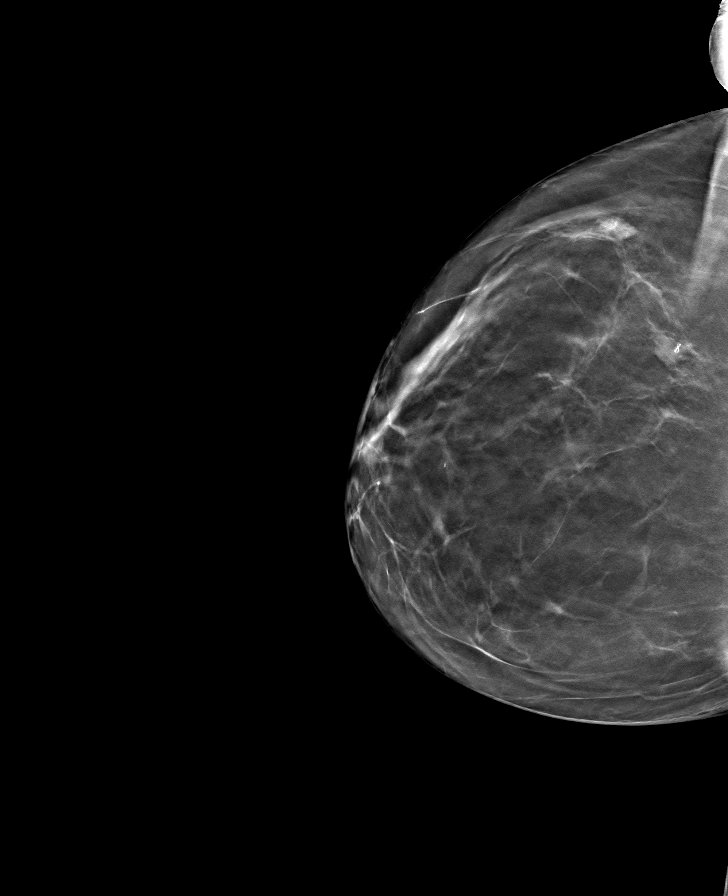

[L CC tomo · tomo slice 34/67.0]
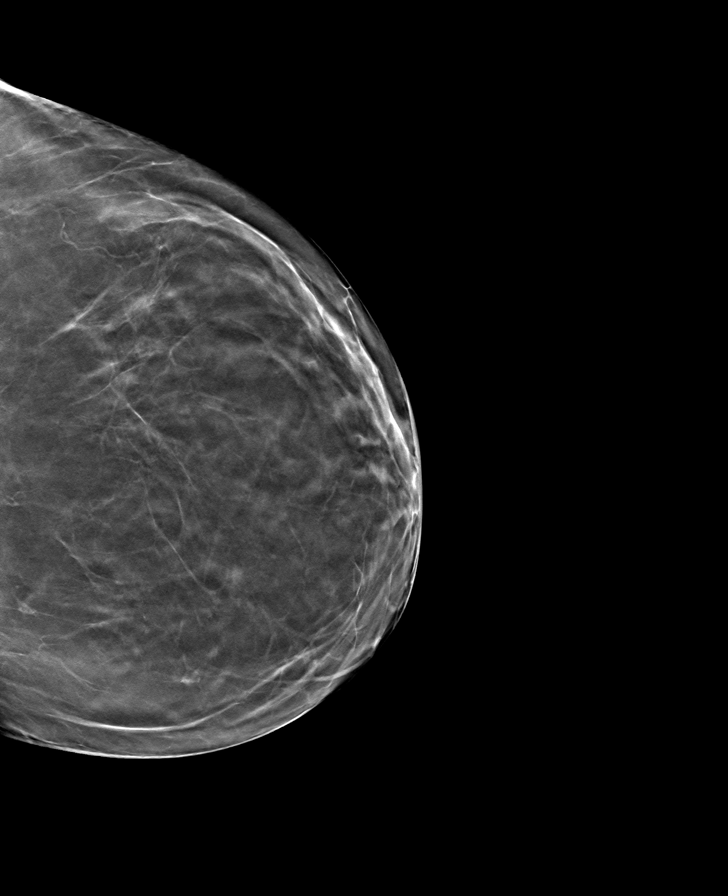

[8 of 24 positions shown; findings below may reference images not displayed]

ACR Breast Density Category b: There are scattered areas of
fibroglandular density.
FINDINGS: There are no findings suspicious for malignancy.
IMPRESSION: No mammographic evidence of malignancy. A result letter of this
screening mammogram will be mailed directly to the patient.

RECOMMENDATION:
Screening mammogram in one year. (Code:51-O-LD2)

BI-RADS CATEGORY  1: Negative.

## 2023-06-15 NOTE — Telephone Encounter (Signed)
Ok to place referral to endocrine for cone, but also let her know that we could treat her as well!

## 2023-07-04 ENCOUNTER — Telehealth: Payer: Self-pay | Admitting: Pharmacy Technician

## 2023-07-04 ENCOUNTER — Other Ambulatory Visit (HOSPITAL_COMMUNITY): Payer: Self-pay

## 2023-07-04 NOTE — Telephone Encounter (Signed)
 Pharmacy Patient Advocate Encounter   Received notification from CoverMyMeds that prior authorization for Diclofenac Sodium 3% gel is required/requested.   Insurance verification completed.   The patient is insured through [employer] .   Per test claim: PA required; PA submitted to above mentioned insurance via CoverMyMeds Key/confirmation #/EOC [id number] Status is pending

## 2023-07-06 ENCOUNTER — Encounter: Payer: Self-pay | Admitting: Family Medicine

## 2023-07-06 NOTE — Telephone Encounter (Signed)
 Hey! Can you check this patient's benefits to see if Avenity and/or Prolia is covered? Thanks!

## 2023-07-07 NOTE — Telephone Encounter (Signed)
 Pharmacy Patient Advocate Encounter  Received notification from [facility] that Prior Authorization for Diclofenac Sodium 3% gel  has been DENIED.  Full denial letter will be uploaded to the media tab. See denial reason below. Medicare allows [place] to cover a drug only when it is a Part D drug. A Part D drug is one that is used for a "medically accepted indication." A medically accepted indication means the use is approved by the Food and Drug Administration (FDA) OR the use is supported by one of the following accepted references: (1) [facility] Formulary Service Drug Information. (2) [employer] DRUGDEX Information System. 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 These condition(s) are not supported by one of the accepted references. Therefore your drug is denied because it is not being used for a "medically accepted indication."   PA #/Case ID/Reference #: [id number]

## 2023-07-07 NOTE — Telephone Encounter (Signed)
 Benefit verification pending for both Evenity and Prolia.

## 2023-07-07 NOTE — Telephone Encounter (Signed)
 Prolia is estimated to be about $315 per injection (every 6 months) and Evenity is estimated to be $400+ per injection (monthly.) pt has to pay 20% of the cost. Will need to know if pt wants to proceed and which injection to proceed with as it will need a prior authorization.

## 2023-09-06 ENCOUNTER — Ambulatory Visit: Payer: Medicare Other | Admitting: Endocrinology

## 2023-09-06 ENCOUNTER — Encounter: Payer: Self-pay | Admitting: Endocrinology

## 2023-09-06 VITALS — BP 128/80 | HR 70 | Resp 20 | Ht 62.5 in | Wt 120.0 lb

## 2023-09-06 DIAGNOSIS — M810 Age-related osteoporosis without current pathological fracture: Secondary | ICD-10-CM | POA: Diagnosis not present

## 2023-09-06 DIAGNOSIS — E559 Vitamin D deficiency, unspecified: Secondary | ICD-10-CM

## 2023-09-06 NOTE — Progress Notes (Unsigned)
 Outpatient Endocrinology Note [place] Anara Cowman, MD   Patient's Name: Rebecca Davidson    DOB: 08-23-1945    MRN: [id number]  REASON OF VISIT: New consult for osteoporosis  REFERRING PROVIDER: Aida House, MD  PCP: Aida House, MD  HISTORY OF PRESENT ILLNESS:   Rebecca Davidson is a 78 y.o. old female with past medical history listed below, is here for new consult of bone health issues / osteoporosis.  Pertinent Bone Health History: Patient is referred to endocrinology for evaluation and management of osteoporosis.  Patient was diagnosed with osteoporosis based on DEXA scan done in March 23, 2023, with lowest T-score of -4.0 at forearm, T-score of -3.3 at right femoral neck and T-score of -2.9 at  total femur mean.  She was started on Fosamax /alendronate , took for about 5 weeks and stopped due to GI intolerance.  Patient reports she had DEXA scan several years ago when she was in [place]  was consistent with osteopenia.   Bone Health Concerns:  Patient is not a smoker, does not consume alcohol, no use of glucocorticoid.  No anticonvulsant use.  She does not take any antiacid medication.  No history of malabsorption or thyroid  disorder.  Patient has history of kidney stone, 4 times in the past last kidney stone was in ~ 2018.  Father had history of kidney stone as well.  Paternal grandmother had osteoporosis.  No risks of frequent fall.  She had menopause around the age of 26.   Fracture history: She had shoulder dislocation and fracture from fall in 12/2018, and ankle fracture at the age of 81.  Osteoporosis medications: Took Fosamax  for 5 weeks around June 2024 and stopped due to GI intolerance.  Secondary workup for osteoporosis: n/a  Calcium  intake from supplements: calcium  1400 mg daily in divided doses.  Dietary calcium  intake: fair amount of cheese daily.  Current vitamin D  intake: 1000 international units daily.  Relevant comorbidities: No history of bone cancer. No  history of external radiation treatment.   No history of diabetes mellitus.  No Rheumatoid arthritis. No history of GERD.  No history of recent invasive dental procedures. Not planning dental procedures in the near future. No CAD or stroke.   Imagings: EXAM: March 23, 2023 DUAL X-RAY ABSORPTIOMETRY (DXA) FOR BONE MINERAL DENSITY   IMPRESSION: Referring Physician:  Aida House Your patient completed a bone mineral density test using GE Lunar iDXA system (analysis version: 16). Technologist: HPJ PATIENT: Name: Rebecca Davidson, Rebecca Davidson Patient ID: [id number] Birth Date: 1945/10/31 Height: 62.0 in. Sex: Female Measured: 03/23/2023 Weight: 114.0 lbs. Indications: Advanced Age, Bilateral Oophorectomy (65.51), Caucasian, Estrogen Deficient, Height Loss (781.91), Postmenopausal, Vitamin D  Deficient Fractures: Left Ankle Treatments: Calcium  (E943.0), Multivitamin, Vitamin D  (E933.5)   ASSESSMENT: The BMD measured at Forearm Radius 33% is 0.521 g/cm2 with a T-score of -4.0. This patient is considered osteoporotic according to World Health Organization [facility]) criteria.   The lumbar spine was excluded due to degenerative changes. The quality of the exam is good.   Site Region Measured Date Measured Age YA BMD Significant CHANGE T-score Left Forearm Radius 33% 2/23/2023 78.1 -3.0 0.521 g/cm2   DualFemur Neck Right 03/23/2023 78.1 -3.3 0.572 g/cm2   DualFemur Total Mean 03/23/2023 78.1 -2.9 0.639 g/cm2   World Health Organization [facility]) criteria for post-menopausal, Caucasian Women: Normal       T-score at or above -1 SD Osteopenia   T-score between -1 and -2.5 SD Osteoporosis T-score at or below -2.5 SD  #  Thyroid  nodule : Patient reports she has history of multiple thyroid  nodules, was following with endocrinology when she was in [place] , had FNA/biopsy with benign cytology, evaluated and had ultrasound about 12+ years ago.  Patient states that she was told she does not need  anymore follow-up for thyroid  nodules.  She has no neck compressive symptoms.  She is euthyroid and not on thyroid  medication.  Consider repeat ultrasound thyroid  in the future.   REVIEW OF SYSTEMS:  As per history of present illness.   PAST MEDICAL HISTORY: Past Medical History:  Diagnosis Date   Chronic headaches    Hypercholesteremia    Hypertension    Kidney stone    Nephrolithiasis    Thyroid  disease     PAST SURGICAL HISTORY: Past Surgical History:  Procedure Laterality Date   APPENDECTOMY N/A 07/10/1998   BREAST BIOPSY Right    oophorectomy Bilateral    ovaries and tubes   TUBAL LIGATION      ALLERGIES: Allergies  Allergen Reactions   Ciprofloxacin     Made her sick    Macrobid [Nitrofurantoin]     Makes her sick   Myrbetriq [Mirabegron] Nausea Only   Doxycycline Rash   Sulfa Antibiotics Swelling and Rash    FAMILY HISTORY:  Family History  Problem Relation Age of Onset   Alzheimer's disease Mother    Diabetes Mother    Aneurysm Father    Hypertension Sister    Hyperlipidemia Sister    Breast cancer Sister    Diabetes type II Sister    Heart disease Maternal Grandfather    Crohn's disease Daughter    Celiac disease Son    Colon cancer Neg Hx    Esophageal cancer Neg Hx    Stomach cancer Neg Hx     SOCIAL HISTORY: Social History   Socioeconomic History   Marital status: Divorced    Spouse name: Not on file   Number of children: 5   Years of education: Not on file   Highest education level: 12th grade  Occupational History   Occupation: retired  Tobacco Use   Smoking status: Never   Smokeless tobacco: Never  Vaping Use   Vaping status: Never Used  Substance and Sexual Activity   Alcohol use: Not Currently   Drug use: Not on file   Sexual activity: Not on file  Other Topics Concern   Not on file  Social History Narrative   Not on file   Social Drivers of Health   Financial Resource Strain: Low Risk  (05/15/2023)   Overall  Financial Resource Strain (CARDIA)    Difficulty of Paying Living Expenses: Not hard at all  Food Insecurity: No Food Insecurity (05/15/2023)   Hunger Vital Sign    Worried About Running Out of Food in the Last Year: Never true    Ran Out of Food in the Last Year: Never true  Transportation Needs: No Transportation Needs (05/15/2023)   PRAPARE - Administrator, Civil Service (Medical): No    Lack of Transportation (Non-Medical): No  Physical Activity: Insufficiently Active (05/15/2023)   Exercise Vital Sign    Days of Exercise per Week: 4 days    Minutes of Exercise per Session: 30 min  Stress: No Stress Concern Present (05/15/2023)   [employer] of Occupational Health - Occupational Stress Questionnaire    Feeling of Stress : Not at all  Social Connections: Socially Isolated (05/15/2023)   Social Connection and Isolation Panel [NHANES]  Frequency of Communication with Friends and Family: More than three times a week    Frequency of Social Gatherings with Friends and Family: Twice a week    Attends Religious Services: Never    Database administrator or Organizations: No    Attends Engineer, structural: Not on file    Marital Status: Divorced    MEDICATIONS:  Current Outpatient Medications  Medication Sig Dispense Refill   CALCIUM  PO Take 1,400 mg by mouth daily.     cholecalciferol (VITAMIN D3) 25 MCG (1000 UT) tablet Take 1,000 Units by mouth daily.     Magnesium 250 MG TABS Take 250 mg by mouth daily.     melatonin 5 MG TABS Take 10 mg by mouth at bedtime as needed.     Multiple Vitamins-Minerals (CENTRUM ADULTS PO) Take 1 tablet by mouth daily.     propranolol  ER (INDERAL  LA) 60 MG 24 hr capsule Take 1 capsule (60 mg total) by mouth daily. 90 capsule 1   SUMAtriptan  (IMITREX ) 100 MG tablet May repeat in 2 hours if headache persists or recurs. 10 tablet 5   alendronate  (FOSAMAX ) 70 MG tablet Take 1 tablet (70 mg total) by mouth every 7 (seven) days. Take  with a full glass of water on an empty stomach. (Patient not taking: Reported on 09/06/2023) 12 tablet 3   Diclofenac  Sodium 3 % GEL 1 gram qid (Patient not taking: Reported on 09/06/2023) 100 g 6   lidocaine -prilocaine  (EMLA ) cream 1 gram qid as needed. (Patient not taking: Reported on 09/06/2023) 30 g 11   nortriptyline  (PAMELOR ) 10 MG capsule TAKE 2 CAPSULES BY MOUTH AT BEDTIME. (Patient not taking: Reported on 09/06/2023) 180 capsule 4   No current facility-administered medications for this visit.    PHYSICAL EXAM: Vitals:   09/06/23 1355  BP: 128/80  Pulse: 70  Resp: 20  SpO2: 98%  Weight: 120 lb (54.4 kg)  Height: 5' 2.5" (1.588 m)   Body mass index is 21.6 kg/m.  Wt Readings from Last 3 Encounters:  09/06/23 120 lb (54.4 kg)  06/05/23 116 lb 11.2 oz (52.9 kg)  05/23/23 119 lb (54 kg)    General: Well developed, well nourished female in no apparent distress.  HEENT: AT/[place], no external lesions. Hearing intact to the spoken word Eyes: EOMI. Conjunctiva clear and no icterus. Neck: Trachea midline, neck supple without appreciable thyromegaly or lymphadenopathy and no palpable thyroid  nodules Lungs: Clear to auscultation, no wheeze. Respirations not labored Heart: S1S2, Regular in rate and rhythm.  Abdomen: Soft, non tender, non distended, no striae Neurologic: Alert, oriented, normal speech, deep tendon biceps reflexes normal,  no gross focal neurological deficit Extremities: No pedal pitting edema, no tremors of outstretched hands. No spine tenderness Skin: Warm, color good.  Psychiatric: Does not appear depressed or anxious  PERTINENT HISTORIC LABORATORY AND IMAGING STUDIES:  All pertinent laboratory results were reviewed. Please see HPI also for further details.   Lab Results  Component Value Date   ALKPHOS 72 05/16/2023   ALKPHOS 88 01/26/2022   ALKPHOS 109 03/01/2021     ASSESSMENT / PLAN  1. Age-related osteoporosis without current pathological fracture   2. Vitamin  D deficiency     Rebecca Davidson is a 78 y.o. old female with osteoporosis based on DXA scan results and history of fragility fractures. Patient has multiple risk factors for osteoporosis, including age, postmenopausal status.  Patient has history of kidney stone.  -DEXA scan in November 2021 consistent  with osteoporosis with lowest T-score of -4.0 at forearm.  - The patient did not tolerate Fosamax  due to GI side effects.  With the severe osteoporosis I would like to consider anabolic therapy for osteoporosis.  Discussed different options of anabolic versus antiresorptive therapy.  Before starting pharmacological treatment for osteoporosis I would like to do secondary workup for osteoporosis as follows.  Plan: - Check renal function panel, PTH, vitamin D  level. - Check protein electrophoresis and 24-hour urine calcium  -  She had normal thyroid  function test. - Will plan for pharmacological treatment for osteoporosis consider anabolic treatment prior to antiresorptive therapy. - She is currently taking calcium  1400 mg in divided doses daily and vitamin D3 1000 international unit daily. - Discussed about weightbearing exercise and fall precautions. - Follow-up in 4 weeks.   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  All questions answered and patient verbalized understanding of the plan.  Iraq Phala Schraeder, MD [facility] [address] [place], [address] [place], [place] [address] Phone # [phone]  At least part of this note was generated using  voice recognition software. Inadvertent word errors may have occurred, which were not recognized during the proofreading process.

## 2023-09-06 NOTE — Patient Instructions (Addendum)
 Lab today.  24-hr Urine Collection: - Preferably to be done while staying at home and start in the morning.  - Discard the first urine of the morning after you wake up because that was the urine from overnight. DO NOT COLLECT FIRST URINE. Then begin 24-hour urine collection.  Collect your urine every time you pee for next 24 hours which means all day and overnight until next morning.  You collect the last urine next morning, that COMPLETES the collection.

## 2023-09-07 ENCOUNTER — Encounter: Payer: Self-pay | Admitting: Endocrinology

## 2023-09-08 LAB — PROTEIN ELECTROPHORESIS, SERUM, WITH REFLEX
Albumin ELP: 4.2 g/dL (ref 3.8–4.8)
Alpha 1: 0.3 g/dL (ref 0.2–0.3)
Alpha 2: 0.7 g/dL (ref 0.5–0.9)
Beta 2: 0.4 g/dL (ref 0.2–0.5)
Beta Globulin: 0.4 g/dL (ref 0.4–0.6)
Gamma Globulin: 1 g/dL (ref 0.8–1.7)
Total Protein: 7 g/dL (ref 6.1–8.1)

## 2023-09-08 LAB — RENAL FUNCTION PANEL
Albumin: 4.4 g/dL (ref 3.6–5.1)
BUN: 18 mg/dL (ref 7–25)
CO2: 20 mmol/L (ref 20–32)
Calcium: 10.3 mg/dL (ref 8.6–10.4)
Chloride: 108 mmol/L (ref 98–110)
Creat: 0.69 mg/dL (ref 0.60–1.00)
Glucose, Bld: 98 mg/dL (ref 65–99)
Phosphorus: 4.3 mg/dL (ref 2.1–4.3)
Potassium: 4.9 mmol/L (ref 3.5–5.3)
Sodium: 147 mmol/L — ABNORMAL HIGH (ref 135–146)

## 2023-09-08 LAB — PARATHYROID HORMONE, INTACT (NO CA): PTH: 34 pg/mL (ref 16–77)

## 2023-09-08 LAB — VITAMIN D 25 HYDROXY (VIT D DEFICIENCY, FRACTURES): Vit D, 25-Hydroxy: 60 ng/mL (ref 30–100)

## 2023-09-11 ENCOUNTER — Encounter: Payer: Self-pay | Admitting: Endocrinology

## 2023-09-21 ENCOUNTER — Other Ambulatory Visit

## 2023-09-21 DIAGNOSIS — E559 Vitamin D deficiency, unspecified: Secondary | ICD-10-CM | POA: Diagnosis not present

## 2023-09-21 DIAGNOSIS — M810 Age-related osteoporosis without current pathological fracture: Secondary | ICD-10-CM | POA: Diagnosis not present

## 2023-09-22 ENCOUNTER — Ambulatory Visit: Payer: Self-pay | Admitting: Endocrinology

## 2023-09-22 LAB — CALCIUM, 24-HOUR URINE WITH CREATININE
CALCIUM/CREATININE RATIO: 283 mg/g{creat} — ABNORMAL HIGH (ref 30–275)
Calcium, 24H Urine: 188 mg/(24.h)
Creatinine, 24H Ur: 0.66 g/(24.h) (ref 0.50–2.15)

## 2023-10-12 ENCOUNTER — Telehealth: Payer: Self-pay

## 2023-10-12 ENCOUNTER — Encounter: Payer: Self-pay | Admitting: Endocrinology

## 2023-10-12 ENCOUNTER — Ambulatory Visit: Admitting: Endocrinology

## 2023-10-12 VITALS — BP 118/70 | HR 68 | Resp 20 | Ht 62.0 in | Wt 119.8 lb

## 2023-10-12 DIAGNOSIS — M810 Age-related osteoporosis without current pathological fracture: Secondary | ICD-10-CM | POA: Diagnosis not present

## 2023-10-12 NOTE — Progress Notes (Signed)
 Outpatient Endocrinology Note [place] Senie Lanese, MD   Patient's Name: Rebecca Davidson    DOB: 03/02/1946    MRN: [id number]  REASON OF VISIT: Follow up for osteoporosis  REFERRING PROVIDER: Aida House, MD  PCP: Aida House, MD  HISTORY OF PRESENT ILLNESS:   Rebecca Davidson is a 78 y.o. old female with past medical history listed below, is here for follow up of bone health issues / osteoporosis.  Pertinent Bone Health History: Patient was referred to endocrinology for evaluation and management of osteoporosis.  Patient was diagnosed with osteoporosis based on DEXA scan done in March 23, 2023, with lowest T-score of -4.0 at forearm, T-score of -3.3 at right femoral neck and T-score of -2.9 at  total femur mean.  She was started on Fosamax /alendronate , took for about 5 weeks and stopped due to GI intolerance.  Patient reports she had DEXA scan several years ago when she was in [place]  was consistent with osteopenia.  Initial consult in May 2025.   Bone Health Concerns:  Patient is not a smoker, does not consume alcohol, no use of glucocorticoid.  No anticonvulsant use.  She does not take any antiacid medication.  No history of malabsorption or thyroid  disorder.  Patient has history of kidney stone, 4 times in the past last kidney stone was in ~ 2018.  Father had history of kidney stone as well.  Paternal grandmother had osteoporosis.  No risks of frequent fall.  She had menopause around the age of 75.   Fracture history: She had shoulder dislocation and fracture from fall in 12/2018, and ankle fracture at the age of 65.  Osteoporosis medications: Took Fosamax  for 5 weeks around June 2024 and stopped due to GI intolerance.  Secondary workup for osteoporosis: Normal protein electrophoresis.  Normal parathyroid  hormone current, serum calcium  and vitamin D .  Mild elevated 24 hr urine calcium  283 likely related to high calcium  intake.  Calcium  intake from supplements: calcium  1400 mg  daily in divided doses.  Dietary calcium  intake: fair amount of cheese daily.  Current vitamin D  intake: 1000 international units daily.  Relevant comorbidities: No history of bone cancer. No history of external radiation treatment.   No history of diabetes mellitus.  No Rheumatoid arthritis. No history of GERD.  No history of recent invasive dental procedures. Not planning dental procedures in the near future. No CAD or stroke.   Imagings: EXAM: March 23, 2023 DUAL X-RAY ABSORPTIOMETRY (DXA) FOR BONE MINERAL DENSITY   IMPRESSION: Referring Physician:  Aida House Your patient completed a bone mineral density test using GE Lunar iDXA system (analysis version: 16). Technologist: HPJ PATIENT: Name: Rebecca, Davidson Patient ID: [id number] Birth Date: [birth year] Height: 62.0 in. Sex: Female Measured: 03/23/2023 Weight: 114.0 lbs. Indications: Advanced Age, Bilateral Oophorectomy (65.51), Caucasian, Estrogen Deficient, Height Loss (781.91), Postmenopausal, Vitamin D  Deficient Fractures: Left Ankle Treatments: Calcium  (E943.0), Multivitamin, Vitamin D  (E933.5)   ASSESSMENT: The BMD measured at Forearm Radius 33% is 0.521 g/cm2 with a T-score of -4.0. This patient is considered osteoporotic according to World Health Organization [facility]) criteria.   The lumbar spine was excluded due to degenerative changes. The quality of the exam is good.   Site Region Measured Date Measured Age YA BMD Significant CHANGE T-score Left Forearm Radius 33% 2/23/2023 78.1 -4.0 0.521 g/cm2   DualFemur Neck Right 03/23/2023 78.1 -3.3 0.572 g/cm2   DualFemur Total Mean 03/23/2023 78.1 -2.9 0.639 g/cm2   World Health Organization [facility]) criteria for post-menopausal,  Caucasian Women: Normal       T-score at or above -1 SD Osteopenia   T-score between -1 and -2.5 SD Osteoporosis T-score at or below -2.5 SD   # Thyroid  nodule : Patient reports she has history of multiple thyroid  nodules, was  following with endocrinology when she was in [place] , had FNA/biopsy with benign cytology, evaluated and had ultrasound about 12+ years ago.  Patient states that she was told she does not need anymore follow-up for thyroid  nodules.  She has no neck compressive symptoms.  She is euthyroid and not on thyroid  medication.  Consider repeat ultrasound thyroid  in the future.  Interval history: Patient had laboratory evaluation for secondary workup for osteoporosis unremarkable the last visit in May.  Patient presented today to review lab results and make a further plan for osteoporosis treatment.  She has been taking calcium  total of 1400 mg daily along with vitamin D  supplement.  She has no falls or fractures.  No other complaints today.  She eats cheese however does not take much of other dairy products for example milk.   REVIEW OF SYSTEMS:  As per history of present illness.   PAST MEDICAL HISTORY: Past Medical History:  Diagnosis Date   Chronic headaches    Hypercholesteremia    Hypertension    Kidney stone    Nephrolithiasis    Thyroid  disease     PAST SURGICAL HISTORY: Past Surgical History:  Procedure Laterality Date   APPENDECTOMY N/A 07/10/1998   BREAST BIOPSY Right    oophorectomy Bilateral    ovaries and tubes   TUBAL LIGATION      ALLERGIES: Allergies  Allergen Reactions   Ciprofloxacin     Made her sick    Macrobid [Nitrofurantoin]     Makes her sick   Myrbetriq [Mirabegron] Nausea Only   Doxycycline Rash   Sulfa Antibiotics Swelling and Rash    FAMILY HISTORY:  Family History  Problem Relation Age of Onset   Alzheimer's disease Mother    Diabetes Mother    Aneurysm Father    Hypertension Sister    Hyperlipidemia Sister    Breast cancer Sister    Diabetes type II Sister    Heart disease Maternal Grandfather    Crohn's disease Daughter    Celiac disease Son    Colon cancer Neg Hx    Esophageal cancer Neg Hx    Stomach cancer Neg Hx     SOCIAL  HISTORY: Social History   Socioeconomic History   Marital status: Divorced    Spouse name: Not on file   Number of children: 5   Years of education: Not on file   Highest education level: 12th grade  Occupational History   Occupation: retired  Tobacco Use   Smoking status: Never   Smokeless tobacco: Never  Vaping Use   Vaping status: Never Used  Substance and Sexual Activity   Alcohol use: Not Currently   Drug use: Not on file   Sexual activity: Not on file  Other Topics Concern   Not on file  Social History Narrative   Not on file   Social Drivers of Health   Financial Resource Strain: Low Risk  (05/15/2023)   Overall Financial Resource Strain (CARDIA)    Difficulty of Paying Living Expenses: Not hard at all  Food Insecurity: No Food Insecurity (05/15/2023)   Hunger Vital Sign    Worried About Running Out of Food in the Last Year: Never true    Ran  Out of Food in the Last Year: Never true  Transportation Needs: No Transportation Needs (05/15/2023)   PRAPARE - Administrator, Civil Service (Medical): No    Lack of Transportation (Non-Medical): No  Physical Activity: Insufficiently Active (05/15/2023)   Exercise Vital Sign    Days of Exercise per Week: 4 days    Minutes of Exercise per Session: 30 min  Stress: No Stress Concern Present (05/15/2023)   [employer] of Occupational Health - Occupational Stress Questionnaire    Feeling of Stress : Not at all  Social Connections: Socially Isolated (05/15/2023)   Social Connection and Isolation Panel    Frequency of Communication with Friends and Family: More than three times a week    Frequency of Social Gatherings with Friends and Family: Twice a week    Attends Religious Services: Never    Database administrator or Organizations: No    Attends Engineer, structural: Not on file    Marital Status: Divorced    MEDICATIONS:  Current Outpatient Medications  Medication Sig Dispense Refill   CALCIUM   PO Take 1,400 mg by mouth daily.     cholecalciferol (VITAMIN D3) 25 MCG (1000 UT) tablet Take 1,000 Units by mouth daily.     Magnesium 250 MG TABS Take 250 mg by mouth daily.     melatonin 5 MG TABS Take 10 mg by mouth at bedtime as needed.     Multiple Vitamins-Minerals (CENTRUM ADULTS PO) Take 1 tablet by mouth daily.     propranolol  ER (INDERAL  LA) 60 MG 24 hr capsule Take 1 capsule (60 mg total) by mouth daily. 90 capsule 1   SUMAtriptan  (IMITREX ) 100 MG tablet May repeat in 2 hours if headache persists or recurs. 10 tablet 5   alendronate  (FOSAMAX ) 70 MG tablet Take 1 tablet (70 mg total) by mouth every 7 (seven) days. Take with a full glass of water on an empty stomach. (Patient not taking: Reported on 10/12/2023) 12 tablet 3   Diclofenac  Sodium 3 % GEL 1 gram qid (Patient not taking: Reported on 10/12/2023) 100 g 6   lidocaine -prilocaine  (EMLA ) cream 1 gram qid as needed. (Patient not taking: Reported on 10/12/2023) 30 g 11   nortriptyline  (PAMELOR ) 10 MG capsule TAKE 2 CAPSULES BY MOUTH AT BEDTIME. (Patient not taking: Reported on 10/12/2023) 180 capsule 4   No current facility-administered medications for this visit.    PHYSICAL EXAM: Vitals:   10/12/23 1041  BP: 118/70  Pulse: 68  Resp: 20  SpO2: 97%  Weight: 119 lb 12.8 oz (54.3 kg)  Height: 5' 2 (1.575 m)   Body mass index is 21.91 kg/m.  Wt Readings from Last 3 Encounters:  10/12/23 119 lb 12.8 oz (54.3 kg)  09/06/23 120 lb (54.4 kg)  06/05/23 116 lb 11.2 oz (52.9 kg)    General: Well developed, well nourished female in no apparent distress.  HEENT: AT/[place], no external lesions. Hearing intact to the spoken word Eyes: EOMI. Conjunctiva clear and no icterus. Neck: Trachea midline, neck supple without appreciable thyromegaly or lymphadenopathy and no palpable thyroid  nodules Lungs: Clear to auscultation, no wheeze. Respirations not labored Heart: S1S2, Regular in rate and rhythm.  Abdomen: Soft, non tender, non  distended, no striae Neurologic: Alert, oriented, normal speech, deep tendon biceps reflexes normal,  no gross focal neurological deficit Extremities: No pedal pitting edema, no tremors of outstretched hands. No spine tenderness Skin: Warm, color good.  Psychiatric: Does not appear depressed  or anxious  PERTINENT HISTORIC LABORATORY AND IMAGING STUDIES:  All pertinent laboratory results were reviewed. Please see HPI also for further details.   Lab Results  Component Value Date   ALKPHOS 72 05/16/2023   ALKPHOS 88 01/26/2022   ALKPHOS 109 03/01/2021     ASSESSMENT / PLAN  1. Age-related osteoporosis without current pathological fracture   2. Postmenopausal osteoporosis     Ms. Riess is a 78 y.o. old female with osteoporosis based on DXA scan results and history of fragility fractures. Patient has multiple risk factors for osteoporosis, including age, postmenopausal status.  Patient has history of kidney stone.  -DEXA scan in November 2024 consistent with osteoporosis with lowest T-score of -4.0 at forearm.  - The patient did not tolerate Fosamax  due to GI side effects.    - Secondary workup for osteoporosis unremarkable.    -Due to severe osteoporosis with fragility fracture I would like to treat her with anabolic therapy.  Plan: -Start Evenity every month for 12 months, followed by antiresorptive therapy.  Will start prior authorization in process.  No obvious contraindication for evenity.  Discussed about potential side effects and provided written instruction. - Continue current vitamin D  supplement.  Advised patient to take calcium  supplement total 1000 mg/day in divided doses. - Discussed about weightbearing exercise and fall precautions. - Follow-up in 6 months.  Diagnoses and all orders for this visit:  Age-related osteoporosis without current pathological fracture  Postmenopausal osteoporosis    DISPOSITION Follow up in clinic in 6 months suggested.  All  questions answered and patient verbalized understanding of the plan.  [place] Blaire Palomino, MD [facility] Endocrinology [facility] [address] [place], [address] [place], [place] [address] Phone # [phone]  At least part of this note was generated using voice recognition software. Inadvertent word errors may have occurred, which were not recognized during the proofreading process.

## 2023-10-12 NOTE — Patient Instructions (Signed)
 Romosozumab Injection  What is this medication?  ROMOSOZUMAB (roe moe SOZ ue mab) prevents and treats osteoporosis. It works by Interior and spatial designer stronger and less likely to break (fracture). It is a monoclonal antibody.  This medicine may be used for other purposes; ask your health care provider or pharmacist if you have questions.  COMMON BRAND NAME(S): EVENITY  What should I tell my care team before I take this medication?  They need to know if you have any of these conditions:  Dental disease  Heart attack  Heart disease  Kidney problems  Low levels of calcium in the blood  On dialysis  Stroke  Wear dentures  An unusual or allergic reaction to romosozumab, other medications, foods, dyes or preservatives  Pregnant or trying to get pregnant  Breast-feeding  How should I use this medication?  This medication is injected under the skin. It is given by your care team in a hospital or clinic setting.  A special MedGuide will be given to you by the pharmacist with each prescription and refill. Be sure to read this information carefully each time.  Talk to your care team about the use of this medication in children. Special care may be needed.  Overdosage: If you think you have taken too much of this medicine contact a poison control center or emergency room at once.  NOTE: This medicine is only for you. Do not share this medicine with others.  What if I miss a dose?  Keep appointments for follow-up doses. It is important not to miss your dose. Call your care team if you are unable to keep an appointment.  What may interact with this medication?  Interactions are not expected.  This list may not describe all possible interactions. Give your health care provider a list of all the medicines, herbs, non-prescription drugs, or dietary supplements you use. Also tell them if you smoke, drink alcohol, or use illegal drugs. Some items may interact with your medicine.  What should I watch for while using this medication?  Your  condition will be monitored carefully while you are receiving this medication.  You may need bloodwork while taking this medication.  You should make sure you get enough calcium and vitamin D while you are taking this medication. Discuss the foods you eat and the vitamins you take with your care team.  Some people who take this medication have severe bone, joint, or muscle pain. This medication may also increase your risk for jaw problems or a broken thigh bone. Tell your care team right away if you have severe pain in your jaw, bones, joints, or muscles. Tell you care team if you have any pain that does not go away or that gets worse.  Tell your dentist and dental surgeon that you are taking this medication. You should not have major dental surgery while on this medication. See your dentist to have a dental exam and fix any dental problems before starting this medication. Take good care of your teeth while on this medication. Make sure you see your dentist for regular follow-up appointments.  What side effects may I notice from receiving this medication?  Side effects that you should report to your care team as soon as possible:  Allergic reactions or angioedema--skin rash, itching or hives, swelling of the face, eyes, lips, tongue, arms, or legs, trouble swallowing or breathing  Heart attack--pain or tightness in the chest, shoulders, arms, or jaw, nausea, shortness of breath, cold or clammy skin,  feeling faint or lightheaded  Low calcium level--muscle pain or cramps, confusion, tingling, or numbness in the hands or feet  Osteonecrosis of the jaw--pain, swelling, or redness in the mouth, numbness of the jaw, poor healing after dental work, unusual discharge from the mouth, visible bones in the mouth  Severe bone, joint, or muscle pain  Stroke--sudden numbness or weakness of the face, arm, or leg, trouble speaking, confusion, trouble walking, loss of balance or coordination, dizziness, severe headache, change in  vision  Side effects that usually do not require medical attention (report to your care team if they continue or are bothersome):  Headache  Joint pain  Muscle spasms  Pain, redness, or irritation at injection site  Swelling of the ankles, hands, or feet  This list may not describe all possible side effects. Call your doctor for medical advice about side effects. You may report side effects to FDA at 1-800-FDA-1088.  Where should I keep my medication?  This medication is given in a hospital or clinic. It will not be stored at home.  NOTE: This sheet is a summary. It may not cover all possible information. If you have questions about this medicine, talk to your doctor, pharmacist, or health care provider.   2024 Elsevier/Gold Standard (2021-05-19 00:00:00)

## 2023-10-12 NOTE — Telephone Encounter (Signed)
 benefits screening needed to start [facility]

## 2023-10-17 NOTE — Telephone Encounter (Signed)
 Medical Buy and Raenette Bumps - Prior Authorization REQUIRED for [employer]   PA PROCESS DETAILS: Please ensure your patient meets medical necessity by reviewing Medical Policy  [phone] at [email or web address], and obtaining a pre-determination by contacting the PA dept at [phone].

## 2023-10-20 NOTE — Telephone Encounter (Signed)
 Prior Authorization initiated for [facility] via [facility] Provider portal.  Case ID: [id number]

## 2023-10-23 NOTE — Telephone Encounter (Addendum)
 Medical Buy and [employer]  Prior Authorization for [employer] APPROVED PA# [id number] Valid:  10/20/23-10/19/24

## 2023-11-07 NOTE — Telephone Encounter (Signed)
 Patient is ready for scheduling on or after 10/20/23 BUY AND BILL  Out-of-pocket cost due at time of visit: $502.41 (per injection)  Primary: UHC AARP Medicare Adv HMO POS Evenity co-insurance: 20% (approximately 482.41) Admin fee co-insurance: $20  Deductible: does not apply  Prior Auth: APPROVED PA# [id number] Valid:  10/20/23-10/19/24  Secondary: N/A Evenity co-insurance:  Admin fee co-insurance:  Deductible:  Prior Auth:  PA#  Valid:   ** This summary of benefits is an estimation of the patient's out-of-pocket cost. Exact cost may vary based on individual plan coverage.

## 2023-11-27 ENCOUNTER — Encounter: Payer: Self-pay | Admitting: Endocrinology

## 2023-11-29 ENCOUNTER — Other Ambulatory Visit: Payer: Self-pay | Admitting: Endocrinology

## 2023-11-29 ENCOUNTER — Telehealth: Payer: Self-pay

## 2023-11-29 DIAGNOSIS — M810 Age-related osteoporosis without current pathological fracture: Secondary | ICD-10-CM | POA: Insufficient documentation

## 2023-11-29 NOTE — Telephone Encounter (Signed)
 I already sent referral to infusion center and I also sent a message to clarify cost about Reclast infusion to the patient.

## 2023-11-29 NOTE — Telephone Encounter (Signed)
 Dr. Mercie, patient will be scheduled as soon as possible.  Auth Submission: NO AUTH NEEDED Site of care: Site of care: [facility] Payer: UHC medicare Medication & CPT/J Code(s) submitted: Reclast (Zolendronic acid) [id number] Diagnosis Code:  Route of submission (phone, fax, portal):  Phone # Fax # Auth type: Buy/Bill PB Units/visits requested: 5mg  x 1 dose Reference number:  Approval from: 11/29/23 to 05/01/24

## 2023-11-29 NOTE — Telephone Encounter (Signed)
 We can do Reclast IV infusion annually.  I have placed the order.  Please make sure that cost is okay with you before the infusion, we can check with your infusion center when they call you to schedule.

## 2023-11-29 NOTE — Telephone Encounter (Signed)
 Since the Evenity is not cost effective for you, next best option would be the Reclast.

## 2023-11-29 NOTE — Telephone Encounter (Signed)
 Okay with generic Zolendronic acid.  Please send for prior authorization to determine cost and co-pay for the patient.

## 2023-12-10 NOTE — Telephone Encounter (Signed)
 Cost barrier, changed to Reclast.

## 2024-01-20 ENCOUNTER — Other Ambulatory Visit: Payer: Self-pay | Admitting: Family Medicine

## 2024-01-20 DIAGNOSIS — G43809 Other migraine, not intractable, without status migrainosus: Secondary | ICD-10-CM

## 2024-01-22 ENCOUNTER — Telehealth: Payer: Self-pay | Admitting: *Deleted

## 2024-01-22 DIAGNOSIS — H35372 Puckering of macula, left eye: Secondary | ICD-10-CM | POA: Diagnosis not present

## 2024-01-22 DIAGNOSIS — H1045 Other chronic allergic conjunctivitis: Secondary | ICD-10-CM | POA: Diagnosis not present

## 2024-01-22 DIAGNOSIS — H35342 Macular cyst, hole, or pseudohole, left eye: Secondary | ICD-10-CM | POA: Diagnosis not present

## 2024-01-22 DIAGNOSIS — H04123 Dry eye syndrome of bilateral lacrimal glands: Secondary | ICD-10-CM | POA: Diagnosis not present

## 2024-01-22 NOTE — Telephone Encounter (Signed)
 Copied from CRM #[id number]. Topic: Clinical - Medication Question >> Jan 22, 2024 10:09 AM Avram MATSU wrote: Reason for CRM: patient is requesting for her provider to fill propranolol  ER (INDERAL  LA) 60 MG 24 hr capsule [[id number]] I informed her it was still pending. the pharmacy told her they provider was not able to fill. Please advise [phone]

## 2024-01-22 NOTE — Telephone Encounter (Signed)
 Rx previously sent and per [facility] at [facility] the Rx was picked up.

## 2024-01-30 ENCOUNTER — Ambulatory Visit (INDEPENDENT_AMBULATORY_CARE_PROVIDER_SITE_OTHER)

## 2024-01-30 VITALS — BP 149/89 | HR 77 | Temp 97.6°F | Resp 18 | Ht 62.0 in | Wt 121.6 lb

## 2024-01-30 DIAGNOSIS — M810 Age-related osteoporosis without current pathological fracture: Secondary | ICD-10-CM

## 2024-01-30 MED ORDER — ACETAMINOPHEN 325 MG PO TABS
650.0000 mg | ORAL_TABLET | Freq: Once | ORAL | Status: AC
  Administered 2024-01-30: 650 mg via ORAL
  Filled 2024-01-30: qty 2

## 2024-01-30 MED ORDER — SODIUM CHLORIDE 0.9 % IV SOLN: INTRAVENOUS | Status: DC

## 2024-01-30 MED ORDER — DIPHENHYDRAMINE HCL 25 MG PO CAPS: 25.0000 mg | ORAL_CAPSULE | Freq: Once | ORAL | Status: DC

## 2024-01-30 MED ORDER — ZOLEDRONIC ACID 5 MG/100ML IV SOLN
5.0000 mg | Freq: Once | INTRAVENOUS | Status: AC
  Administered 2024-01-30: 5 mg via INTRAVENOUS
  Filled 2024-01-30: qty 100

## 2024-01-30 NOTE — Patient Instructions (Signed)

## 2024-01-30 NOTE — Progress Notes (Signed)
 Diagnosis: Osteoporosis  Provider:  Lonna Coder MD  Procedure: IV Infusion  IV Type: Peripheral, IV Location: R Antecubital  Reclast (Zolendronic Acid), Dose: 5 mg  Infusion Start Time: 1044  Infusion Stop Time: 1113  Post Infusion IV Care: Observation period completed  Discharge: Condition: Good, Destination: Home . AVS Provided  Performed by:  Eleanor DELENA Bloch, RN

## 2024-01-31 ENCOUNTER — Other Ambulatory Visit: Payer: Self-pay | Admitting: Family Medicine

## 2024-01-31 DIAGNOSIS — Z1231 Encounter for screening mammogram for malignant neoplasm of breast: Secondary | ICD-10-CM

## 2024-03-25 ENCOUNTER — Ambulatory Visit
Admission: RE | Admit: 2024-03-25 | Discharge: 2024-03-25 | Disposition: A | Discharge status: Home / Self Care | Source: Ambulatory Visit | Attending: Family Medicine | Admitting: Family Medicine

## 2024-03-25 DIAGNOSIS — Z1231 Encounter for screening mammogram for malignant neoplasm of breast: Secondary | ICD-10-CM

## 2024-04-01 ENCOUNTER — Telehealth: Payer: Self-pay | Admitting: *Deleted

## 2024-04-01 ENCOUNTER — Ambulatory Visit: Payer: Self-pay | Admitting: Family Medicine

## 2024-04-01 ENCOUNTER — Other Ambulatory Visit: Payer: Self-pay | Admitting: Family Medicine

## 2024-04-01 DIAGNOSIS — R928 Other abnormal and inconclusive findings on diagnostic imaging of breast: Secondary | ICD-10-CM

## 2024-04-01 NOTE — Telephone Encounter (Signed)
 Copied from CRM #[id number]. Topic: General - Other >> Apr 01, 2024  4:11 PM Joesph B wrote: Reason for CRM: Rebecca [facility] from [facility] sent two test to Dr.Michaels basket through epic. She is requesting for this to be signed before patients appointment tomorrow morning at 7:20am.

## 2024-04-02 ENCOUNTER — Inpatient Hospital Stay: Admission: RE | Admit: 2024-04-02 | Discharge: 2024-04-02 | Attending: Family Medicine

## 2024-04-02 ENCOUNTER — Telehealth: Payer: Self-pay | Admitting: *Deleted

## 2024-04-02 ENCOUNTER — Telehealth: Payer: Self-pay | Admitting: Family Medicine

## 2024-04-02 ENCOUNTER — Ambulatory Visit
Admission: RE | Admit: 2024-04-02 | Discharge: 2024-04-02 | Disposition: A | Source: Ambulatory Visit | Attending: Family Medicine

## 2024-04-02 ENCOUNTER — Other Ambulatory Visit: Payer: Self-pay | Admitting: Family Medicine

## 2024-04-02 ENCOUNTER — Ambulatory Visit
Admission: RE | Admit: 2024-04-02 | Discharge: 2024-04-02 | Disposition: A | Source: Ambulatory Visit | Attending: Family Medicine | Admitting: Family Medicine

## 2024-04-02 DIAGNOSIS — N63 Unspecified lump in unspecified breast: Secondary | ICD-10-CM

## 2024-04-02 DIAGNOSIS — R928 Other abnormal and inconclusive findings on diagnostic imaging of breast: Secondary | ICD-10-CM

## 2024-04-02 HISTORY — PX: BREAST BIOPSY: SHX20

## 2024-04-02 NOTE — Telephone Encounter (Signed)
 Ok I signed them again

## 2024-04-02 NOTE — Telephone Encounter (Signed)
 Spoke with Lashonda, informed her Dr Ozell signed the orders  and she will forward the message to [facility].

## 2024-04-02 NOTE — Telephone Encounter (Signed)
 Copied from CRM [id number]. Topic: General - Other >> Apr 02, 2024  9:31 AM Mesmerise C wrote: Reason for CRM: Jonette from [facility] calling to speak to [facility] in regards to orders needing cosign it wasn't cosigned for the one they added and patient has an appt at 1:45 would like a call back from [facility] to discuss can be reached at [phone] ext [phone]

## 2024-04-02 NOTE — Telephone Encounter (Signed)
 Copied from CRM #[id number]. Topic: General - Other >> Apr 02, 2024  9:07 AM Eva FALCON wrote: Reason for CRM: Evette from [facility] is calling back, needs order cosign by Dr. Nena before patient comes back at 1:45. States its for biopsy on right breast. Any questions call [phone]. [phone].

## 2024-04-02 NOTE — Telephone Encounter (Signed)
 Copied from CRM #[id number]. Topic: General - Other >> Apr 01, 2024  4:11 PM Joesph B wrote: Reason for CRM: frances from [facility] sent two test to Dr.Michaels basket through epic. She is requesting for this to be signed before patients appointment tomorrow morning at 7:20am. >> Apr 02, 2024  7:46 AM Larissa RAMAN wrote: Cathlean with the [facility] center requesting to have order signed. She states if order is not signed, patient may not be seen for her appointment.  Callback # [phone] ext [phone] >> Apr 02, 2024  7:41 AM Robinson DEL wrote: Gwenn from the [facility] calling back order for patients mammogram isn't signed and patient has an appoinment this morning for repeat since first one was abnormal. [facility] dropped on call wasn't able to obtain callback information

## 2024-04-02 NOTE — Telephone Encounter (Signed)
I signed them

## 2024-04-03 LAB — SURGICAL PATHOLOGY

## 2024-04-07 ENCOUNTER — Other Ambulatory Visit: Payer: Self-pay | Admitting: Family Medicine

## 2024-04-07 DIAGNOSIS — G43809 Other migraine, not intractable, without status migrainosus: Secondary | ICD-10-CM

## 2024-04-11 ENCOUNTER — Ambulatory Visit: Admitting: Family Medicine

## 2024-04-11 ENCOUNTER — Encounter: Payer: Self-pay | Admitting: Family Medicine

## 2024-04-11 VITALS — BP 150/80 | HR 70 | Temp 98.1°F | Wt 124.0 lb

## 2024-04-11 DIAGNOSIS — I10 Essential (primary) hypertension: Secondary | ICD-10-CM

## 2024-04-11 MED ORDER — AMLODIPINE BESYLATE 5 MG PO TABS: 5.0000 mg | ORAL_TABLET | Freq: Every day | ORAL | 1 refills | Status: AC

## 2024-04-11 NOTE — Progress Notes (Signed)
 Established Patient Office Visit  Subjective   Patient ID: ARACELYS GLADE, female    DOB: 1945/07/12  Age: 78 y.o. MRN: [id number]  No chief complaint on file.   HPI  Discussed the use of AI scribe software for clinical note transcription with the patient, who gave verbal consent to proceed.  History of Present Illness   RENETTE HSU is a 78 year old female with hypertension who presents for blood pressure management.  She has had home blood pressure readings ranging from 130 to 150 systolic and 80 to 90 diastolic, with diastolic values not dropping below 90. She checks at home and notes these values are consistent. She takes propranolol , originally started for vestibular migraines, and her last office blood pressure was 110/74 at her most recent physical. She feels recent stress from a breast biopsy, now reported as normal, may have contributed to the higher readings.  She uses propranolol  for vestibular migraines and dizziness, which it controls, and she has not had recent dizziness.  She previously took lisinopril for blood pressure but stopped it due to a persistent cough.  Her sister had breast cancer at the same age and in the same breast area as her own recent biopsy, which increased her concern about her health and blood pressure.  She recently moved to [place]  to be closer to family and has good family support from her son, daughter-in-law, and grandchildren.      Current Outpatient Medications  Medication Instructions   amLODipine (NORVASC) 5 mg, Oral, Daily   CALCIUM  PO 1,400 mg, Daily   cholecalciferol (VITAMIN D3) 1,000 Units, Daily   Magnesium 250 mg, Daily   melatonin 10 mg, At bedtime PRN   Multiple Vitamins-Minerals (CENTRUM ADULTS PO) 1 tablet, Daily   propranolol  ER (INDERAL  LA) 60 MG 24 hr capsule Oral, Daily   SUMAtriptan  (IMITREX ) 100 MG tablet MAY REPEAT IN 2 HOURS IF HEADACHE PERSISTS OR RECURS.    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     Objective:     BP (!) 150/80   Pulse 70   Temp 98.1 F (36.7 C) (Oral)   Wt 124 lb (56.2 kg)   SpO2 98%   BMI 22.68 kg/m    Physical Exam Vitals reviewed.  Constitutional:      Appearance: Normal appearance. She is well-groomed and normal weight.  Cardiovascular:     Rate and Rhythm: Normal rate and regular rhythm.     Heart sounds: S1 normal and S2 normal.  Pulmonary:     Effort: Pulmonary effort is normal.     Breath sounds: Normal breath sounds and air entry.  Musculoskeletal:     Right lower leg: No edema.     Left lower leg: No edema.  Neurological:     Mental Status: She is alert and oriented to person, place, and time. Mental status is at baseline.     Gait: Gait is intact.  Psychiatric:        Mood and Affect: Mood and affect normal.  Speech: Speech normal.        Behavior: Behavior normal.      No results found for any visits on 04/11/24.    The 10-year ASCVD risk score (Arnett DK, et al., 2019) is: 42.6%    Assessment & Plan:  Primary hypertension -     amLODIPine Besylate; Take 1 tablet (5 mg total) by mouth daily.  Dispense: 90 tablet; Refill: 1   Assessment and Plan    Primary hypertension Blood pressure readings range from 130-150/80-90 mmHg. Current management with propranolol  is suboptimal for blood pressure control. Lisinopril previously caused a cough. Age-related changes in arteries contribute to hypertension. Amlodipine is preferred due to its tolerability and lack of impact on calcium  levels. -  Initiated amlodipine 5 mg once daily. - Continue propranolol  as prescribed. - Monitor blood pressure at home and report via MyChart. - Scheduled follow-up appointment in two months for annual physical and blood pressure reassessment.        Return in about 2 months (around 06/12/2024) for annual physical exam.    Heron CHRISTELLA Sharper, MD

## 2024-04-12 ENCOUNTER — Encounter: Payer: Self-pay | Admitting: Endocrinology

## 2024-04-12 ENCOUNTER — Ambulatory Visit: Admitting: Endocrinology

## 2024-04-12 VITALS — BP 108/78 | HR 74 | Resp 16 | Ht 62.0 in | Wt 123.8 lb

## 2024-04-12 DIAGNOSIS — M810 Age-related osteoporosis without current pathological fracture: Secondary | ICD-10-CM | POA: Diagnosis not present

## 2024-04-12 DIAGNOSIS — E559 Vitamin D deficiency, unspecified: Secondary | ICD-10-CM | POA: Diagnosis not present

## 2024-04-12 DIAGNOSIS — E041 Nontoxic single thyroid nodule: Secondary | ICD-10-CM | POA: Diagnosis not present

## 2024-04-12 NOTE — Progress Notes (Signed)
 Outpatient Endocrinology Note Alessander Sikorski, MD   Patient's Name: JALON BLACKWELDER    DOB: 1945-07-19    MRN: [id number]  REASON OF VISIT: Follow up for osteoporosis  REFERRING PROVIDER: Ozell Heron CHRISTELLA, MD  PCP: Ozell Heron CHRISTELLA, MD  HISTORY OF PRESENT ILLNESS:   STAMATIA MASRI is a 78 y.o. old female with past medical history listed below, is here for follow up of bone health issues / osteoporosis.  Pertinent Bone Health History: Patient was referred to endocrinology for evaluation and management of osteoporosis.  Patient was diagnosed with osteoporosis based on DEXA scan done in March 23, 2023, with lowest T-score of -4.0 at forearm, T-score of -3.3 at right femoral neck and T-score of -2.9 at  total femur mean.  She was started on Fosamax /alendronate , took for about 5 weeks and stopped due to GI intolerance.  Patient reports she had DEXA scan several years ago when she was in [place]  was consistent with osteopenia.  Initial consult in May 2025.   Bone Health Concerns:  Patient is not a smoker, does not consume alcohol, no use of glucocorticoid.  No anticonvulsant use.  She does not take any antiacid medication.  No history of malabsorption or thyroid  disorder.  Patient has history of kidney stone, 4 times in the past last kidney stone was in ~ 2018.  Father had history of kidney stone as well.  Paternal grandmother had osteoporosis.  No risks of frequent fall.  She had menopause around the age of 17.  Fracture history: She had shoulder dislocation and fracture from fall in 12/2018, and ankle fracture at the age of 24.  Osteoporosis medications: Took Fosamax  for 5 weeks around June 2024 and stopped due to GI intolerance. - Evenity was planned however not cost effective.  Started on Reclast  first dose in September 2025.  Secondary workup for osteoporosis: Normal protein electrophoresis.  Normal parathyroid  hormone current, serum calcium  and vitamin D .  Mild elevated 24 hr urine  calcium  283 likely related to high calcium  intake.  Calcium  intake from supplements: calcium  1400 mg daily in divided doses.  Dietary calcium  intake: fair amount of cheese daily.  Current vitamin D  intake: 1000 international units daily.  Relevant comorbidities: No history of bone cancer. No history of external radiation treatment.   No history of diabetes mellitus.  No Rheumatoid arthritis. No history of GERD.  No history of recent invasive dental procedures. Not planning dental procedures in the near future. No CAD or stroke.   Imagings: EXAM: March 23, 2023 DUAL X-RAY ABSORPTIOMETRY (DXA) FOR BONE MINERAL DENSITY   IMPRESSION: Referring Physician:  HERON CHRISTELLA OZELL Your patient completed a bone mineral density test using GE Lunar iDXA system (analysis version: 16). Technologist: HPJ PATIENT: Name: Airam, Runions Patient ID: [id number] Birth Date: July 18, 1945 Height: 62.0 in. Sex: Female Measured: 03/23/2023 Weight: 114.0 lbs. Indications: Advanced Age, Bilateral Oophorectomy (65.51), Caucasian, Estrogen Deficient, Height Loss (781.91), Postmenopausal, Vitamin D  Deficient Fractures: Left Ankle Treatments: Calcium  (E943.0), Multivitamin, Vitamin D  (E933.5)   ASSESSMENT: The BMD measured at Forearm Radius 33% is 0.521 g/cm2 with a T-score of -4.0. This patient is considered osteoporotic according to World Health Organization [facility]) criteria.   The lumbar spine was excluded due to degenerative changes. The quality of the exam is good.   Site Region Measured Date Measured Age YA BMD Significant CHANGE T-score Left Forearm Radius 33% 2/23/2023 78.1 -[age] 0.521 g/cm2   DualFemur Neck Right 03/23/2023 78.1 -3.3 0.572 g/cm2   DualFemur  Total Mean 03/23/2023 78.1 -2.9 0.639 g/cm2   World Health Organization [facility]) criteria for post-menopausal, Caucasian Women: Normal       T-score at or above -1 SD Osteopenia   T-score between -1 and -2.5 SD Osteoporosis T-score at or  below -2.5 SD   # Thyroid  nodule : Patient reports she has history of multiple thyroid  nodules, was following with endocrinology when she was in [place] , had FNA/biopsy with benign cytology, evaluated and had ultrasound about 12+ years ago.  Patient states that she was told she does not need anymore follow-up for thyroid  nodules.  She has no neck compressive symptoms.  She is euthyroid and not on thyroid  medication.  Consider repeat ultrasound thyroid  in the future.  Interval history: Patient had Reclast  on September 30.  Patient reports she had few days of joint pain and cold-like symptoms.  She has been taking calcium  and vitamin D  supplement.  No fall and fracture.  She she is but not much dairy product.  She has no other complaints today.  REVIEW OF SYSTEMS:  As per history of present illness.   PAST MEDICAL HISTORY: Past Medical History:  Diagnosis Date   Chronic headaches    Hypercholesteremia    Hypertension    Kidney stone    Nephrolithiasis    Thyroid  disease     PAST SURGICAL HISTORY: Past Surgical History:  Procedure Laterality Date   APPENDECTOMY N/A 07/10/1998   BREAST BIOPSY Right    BREAST BIOPSY Right 04/02/2024   US  RT BREAST BX W LOC DEV 1ST LESION IMG BX SPEC US  GUIDE 04/02/2024 [facility]   oophorectomy Bilateral    ovaries and tubes   TUBAL LIGATION      ALLERGIES: Allergies  Allergen Reactions   Ciprofloxacin     Made her sick    Macrobid [Nitrofurantoin]     Makes her sick   Myrbetriq [Mirabegron] Nausea Only   Doxycycline Rash   Sulfa Antibiotics Swelling and Rash    FAMILY HISTORY:  Family History  Problem Relation Age of Onset   Alzheimer's disease Mother    Diabetes Mother    Aneurysm Father    Hypertension Sister    Hyperlipidemia Sister    Breast cancer Sister    Diabetes type II Sister    Heart disease Maternal Grandfather    Crohn's disease Daughter    Celiac disease Son    Colon cancer Neg Hx    Esophageal cancer  Neg Hx    Stomach cancer Neg Hx     SOCIAL HISTORY: Social History   Socioeconomic History   Marital status: Divorced    Spouse name: Not on file   Number of children: 5   Years of education: Not on file   Highest education level: 12th grade  Occupational History   Occupation: retired  Tobacco Use   Smoking status: Never   Smokeless tobacco: Never  Vaping Use   Vaping status: Never Used  Substance and Sexual Activity   Alcohol use: Not Currently   Drug use: Not on file   Sexual activity: Not on file  Other Topics Concern   Not on file  Social History Narrative   Not on file   Social Drivers of Health   Tobacco Use: Low Risk (04/12/2024)   Patient History    Smoking Tobacco Use: Never    Smokeless Tobacco Use: Never    Passive Exposure: Not on file  Financial Resource Strain: Low Risk (04/09/2024)   Overall Financial  Resource Strain (CARDIA)    Difficulty of Paying Living Expenses: Not hard at all  Food Insecurity: No Food Insecurity (04/09/2024)   Epic    Worried About Programme Researcher, Broadcasting/film/video in the Last Year: Never true    Ran Out of Food in the Last Year: Never true  Transportation Needs: No Transportation Needs (04/09/2024)   Epic    Lack of Transportation (Medical): No    Lack of Transportation (Non-Medical): No  Physical Activity: Sufficiently Active (04/09/2024)   Exercise Vital Sign    Days of Exercise per Week: 5 days    Minutes of Exercise per Session: 30 min  Stress: No Stress Concern Present (04/09/2024)   [employer] of Occupational Health - Occupational Stress Questionnaire    Feeling of Stress: Only a little  Social Connections: Moderately Isolated (04/09/2024)   Social Connection and Isolation Panel    Frequency of Communication with Friends and Family: More than three times a week    Frequency of Social Gatherings with Friends and Family: More than three times a week    Attends Religious Services: 1 to 4 times per year    Active Member of Clubs  or Organizations: No    Attends Banker Meetings: Not on file    Marital Status: Divorced  Depression (PHQ2-9): Low Risk (05/16/2023)   Depression (PHQ2-9)    PHQ-2 Score: 0  Alcohol Screen: Low Risk (06/05/2023)   Alcohol Screen    Last Alcohol Screening Score (AUDIT): 0  Housing: Low Risk (04/09/2024)   Epic    Unable to Pay for Housing in the Last Year: No    Number of Times Moved in the Last Year: 0    Homeless in the Last Year: No  Utilities: Not on file  Health Literacy: Adequate Health Literacy (06/05/2023)   B1300 Health Literacy    Frequency of need for help with medical instructions: Never    MEDICATIONS:  Current Outpatient Medications  Medication Sig Dispense Refill   amLODipine (NORVASC) 5 MG tablet Take 1 tablet (5 mg total) by mouth daily. 90 tablet 1   CALCIUM  PO Take 1,400 mg by mouth daily.     cholecalciferol (VITAMIN D3) 25 MCG (1000 UT) tablet Take 1,000 Units by mouth daily.     Magnesium 250 MG TABS Take 250 mg by mouth daily.     melatonin 5 MG TABS Take 10 mg by mouth at bedtime as needed.     Multiple Vitamins-Minerals (CENTRUM ADULTS PO) Take 1 tablet by mouth daily.     propranolol  ER (INDERAL  LA) 60 MG 24 hr capsule TAKE 1 CAPSULE BY MOUTH EVERY DAY 90 capsule 0   SUMAtriptan  (IMITREX ) 100 MG tablet MAY REPEAT IN 2 HOURS IF HEADACHE PERSISTS OR RECURS. 10 tablet 1   No current facility-administered medications for this visit.    PHYSICAL EXAM: Vitals:   04/12/24 0946  BP: 108/78  Pulse: 74  Resp: 16  SpO2: 96%  Weight: 123 lb 12.8 oz (56.2 kg)  Height: 5' 2 (1.575 m)   Body mass index is 22.64 kg/m.  Wt Readings from Last 3 Encounters:  04/12/24 123 lb 12.8 oz (56.2 kg)  04/11/24 124 lb (56.2 kg)  01/30/24 121 lb 9.6 oz (55.2 kg)    General: Well developed, well nourished female in no apparent distress.  HEENT: AT/[place], no external lesions. Hearing intact to the spoken word Eyes: EOMI. Conjunctiva clear and no icterus. Neck:  Trachea midline, neck supple without appreciable  thyromegaly or lymphadenopathy and no palpable thyroid  nodules Lungs: Clear to auscultation, no wheeze. Respirations not labored Heart: S1S2, Regular in rate and rhythm.  Abdomen: Soft, non tender, non distended, no striae Neurologic: Alert, oriented, normal speech, deep tendon biceps reflexes normal,  no gross focal neurological deficit Extremities: No pedal pitting edema, no tremors of outstretched hands. No spine tenderness Skin: Warm, color good.  Psychiatric: Does not appear depressed or anxious  PERTINENT HISTORIC LABORATORY AND IMAGING STUDIES:  All pertinent laboratory results were reviewed. Please see HPI also for further details.   Lab Results  Component Value Date   ALKPHOS 72 05/16/2023   ALKPHOS 88 01/26/2022   ALKPHOS 109 03/01/2021     ASSESSMENT / PLAN  1. Age-related osteoporosis without current pathological fracture   2. Vitamin D  deficiency   3. Postmenopausal osteoporosis   4. Thyroid  nodule     Ms. Guillen is a 78 y.o. old female with osteoporosis based on DXA scan results and history of fragility fractures. Patient has multiple risk factors for osteoporosis, including age, postmenopausal status.  Patient has history of kidney stone. - Secondary workup for osteoporosis unremarkable.    -DEXA scan in November 2024 consistent with osteoporosis with lowest T-score of -4.0 at forearm.  - The patient did not tolerate Fosamax  due to GI side effects.  Evenity was plan, was not cost effective.  Reclast  is started, first dose was in September 2025.  Plan: -Continue Reclast  5 mg IV infusion annually, will at least treat for 5 years, after that we will assess for drug holiday. -Next Reclast  infusion in October 2026. - Will check DEXA scan around winter 2026. - Continue current calcium  and vitamin D  supplement. - Discussed about weightbearing exercise and fall precautions. - Follow-up in 9 months, fall 2026.  # Patient  has remote history of thyroid  nodule - Detailed in HPI. - Consider ultrasound thyroid  in the future. - Check thyroid  function test with next set of lab.  Diagnoses and all orders for this visit:  Age-related osteoporosis without current pathological fracture -     Renal function panel -     VITAMIN D  25 Hydroxy (Vit-D Deficiency, Fractures)  Vitamin D  deficiency -     VITAMIN D  25 Hydroxy (Vit-D Deficiency, Fractures)  Postmenopausal osteoporosis  Thyroid  nodule -     T4, free -     TSH   DISPOSITION Follow up in clinic in 9 months suggested.  Labs prior to follow-up visit as ordered.  All questions answered and patient verbalized understanding of the plan.  Anton Cheramie, MD [facility] Endocrinology [facility] [address] [place], [address] [place], [place] [address] Phone # [phone]  At least part of this note was generated using voice recognition software. Inadvertent word errors may have occurred, which were not recognized during the proofreading process.

## 2024-04-17 ENCOUNTER — Other Ambulatory Visit: Payer: Self-pay | Admitting: Family Medicine

## 2024-04-17 DIAGNOSIS — G43809 Other migraine, not intractable, without status migrainosus: Secondary | ICD-10-CM

## 2024-06-12 ENCOUNTER — Encounter: Admitting: Family Medicine

## 2025-01-07 ENCOUNTER — Other Ambulatory Visit

## 2025-01-13 ENCOUNTER — Ambulatory Visit: Admitting: Endocrinology
